# Patient Record
Sex: Male | Born: 1960 | Hispanic: No | Marital: Married | Smoking: Never smoker
Health system: Southern US, Community
[De-identification: ages and names within clinical notes are randomized; demographics above are authoritative.]

## PROBLEM LIST (undated history)

## (undated) DIAGNOSIS — Z86010 Personal history of colonic polyps: Secondary | ICD-10-CM

## (undated) DIAGNOSIS — E041 Nontoxic single thyroid nodule: Secondary | ICD-10-CM

## (undated) DIAGNOSIS — D1720 Benign lipomatous neoplasm of skin and subcutaneous tissue of unspecified limb: Secondary | ICD-10-CM

## (undated) DIAGNOSIS — K5790 Diverticulosis of intestine, part unspecified, without perforation or abscess without bleeding: Secondary | ICD-10-CM

## (undated) DIAGNOSIS — I10 Essential (primary) hypertension: Secondary | ICD-10-CM

## (undated) DIAGNOSIS — Z8639 Personal history of other endocrine, nutritional and metabolic disease: Secondary | ICD-10-CM

## (undated) HISTORY — DX: Nontoxic single thyroid nodule: E04.1

## (undated) HISTORY — DX: Essential (primary) hypertension: I10

## (undated) HISTORY — DX: Personal history of colonic polyps: Z86.010

## (undated) HISTORY — PX: POLYPECTOMY: SHX149

## (undated) HISTORY — DX: Benign lipomatous neoplasm of skin and subcutaneous tissue of unspecified limb: D17.20

## (undated) HISTORY — PX: COLONOSCOPY: SHX174

## (undated) HISTORY — DX: Diverticulosis of intestine, part unspecified, without perforation or abscess without bleeding: K57.90

## (undated) HISTORY — DX: Personal history of other endocrine, nutritional and metabolic disease: Z86.39

---

## 2011-01-18 DIAGNOSIS — Z860101 Personal history of adenomatous and serrated colon polyps: Secondary | ICD-10-CM

## 2011-01-18 DIAGNOSIS — Z86010 Personal history of colonic polyps: Secondary | ICD-10-CM

## 2011-01-18 HISTORY — DX: Personal history of colonic polyps: Z86.010

## 2011-01-18 HISTORY — DX: Personal history of adenomatous and serrated colon polyps: Z86.0101

## 2011-02-18 LAB — HM COLONOSCOPY

## 2014-02-14 ENCOUNTER — Ambulatory Visit: Payer: Self-pay | Admitting: Family Medicine

## 2014-03-17 ENCOUNTER — Encounter: Payer: Self-pay | Admitting: Family Medicine

## 2014-03-17 ENCOUNTER — Ambulatory Visit (INDEPENDENT_AMBULATORY_CARE_PROVIDER_SITE_OTHER): Payer: BC Managed Care – PPO | Admitting: Family Medicine

## 2014-03-17 VITALS — BP 125/82 | HR 73 | Temp 97.9°F | Resp 18 | Ht 70.0 in | Wt 164.0 lb

## 2014-03-17 DIAGNOSIS — Z0000 Encounter for general adult medical examination without abnormal findings: Secondary | ICD-10-CM

## 2014-03-17 DIAGNOSIS — Z125 Encounter for screening for malignant neoplasm of prostate: Secondary | ICD-10-CM

## 2014-03-17 DIAGNOSIS — Z23 Encounter for immunization: Secondary | ICD-10-CM

## 2014-03-17 DIAGNOSIS — I10 Essential (primary) hypertension: Secondary | ICD-10-CM

## 2014-03-17 DIAGNOSIS — E559 Vitamin D deficiency, unspecified: Secondary | ICD-10-CM

## 2014-03-17 LAB — COMPREHENSIVE METABOLIC PANEL
ALK PHOS: 42 U/L (ref 39–117)
ALT: 20 U/L (ref 0–53)
AST: 23 U/L (ref 0–37)
Albumin: 4.6 g/dL (ref 3.5–5.2)
BILIRUBIN TOTAL: 1.2 mg/dL (ref 0.2–1.2)
BUN: 14 mg/dL (ref 6–23)
CALCIUM: 9.4 mg/dL (ref 8.4–10.5)
CO2: 34 mEq/L — ABNORMAL HIGH (ref 19–32)
Chloride: 102 mEq/L (ref 96–112)
Creatinine, Ser: 0.84 mg/dL (ref 0.40–1.50)
GFR: 100.58 mL/min (ref >60.00)
Glucose, Bld: 83 mg/dL (ref 70–99)
POTASSIUM: 4 meq/L (ref 3.5–5.1)
SODIUM: 139 meq/L (ref 135–145)
Total Protein: 7.4 g/dL (ref 6.0–8.3)

## 2014-03-17 LAB — PSA: PSA: 0.33 ng/mL (ref 0.10–4.00)

## 2014-03-17 LAB — VITAMIN D 25 HYDROXY (VIT D DEFICIENCY, FRACTURES): VITD: 44.27 ng/mL (ref 30.00–100.00)

## 2014-03-17 LAB — LIPID PANEL
CHOL/HDL RATIO: 3
CHOLESTEROL: 198 mg/dL (ref 0–200)
HDL: 65 mg/dL (ref >39.00)
LDL Cholesterol: 112 mg/dL — ABNORMAL HIGH (ref 0–99)
NonHDL: 133
Triglycerides: 104 mg/dL (ref 0.0–149.0)
VLDL: 20.8 mg/dL (ref 0.0–40.0)

## 2014-03-17 LAB — CBC WITH DIFFERENTIAL/PLATELET
Basophils Absolute: 0 10*3/uL (ref 0.0–0.1)
Basophils Relative: 0.6 % (ref 0.0–3.0)
Eosinophils Absolute: 0.2 10*3/uL (ref 0.0–0.7)
Eosinophils Relative: 4.3 % (ref 0.0–5.0)
HEMATOCRIT: 41.4 % (ref 39.0–52.0)
HEMOGLOBIN: 14.1 g/dL (ref 13.0–17.0)
LYMPHS ABS: 1.7 10*3/uL (ref 0.7–4.0)
Lymphocytes Relative: 32.6 % (ref 12.0–46.0)
MCHC: 33.9 g/dL (ref 30.0–36.0)
MCV: 94.2 fl (ref 78.0–100.0)
Monocytes Absolute: 0.3 10*3/uL (ref 0.1–1.0)
Monocytes Relative: 6.2 % (ref 3.0–12.0)
Neutro Abs: 2.9 10*3/uL (ref 1.4–7.7)
Neutrophils Relative %: 56.3 % (ref 43.0–77.0)
Platelets: 191 10*3/uL (ref 150.0–400.0)
RBC: 4.4 Mil/uL (ref 4.22–5.81)
RDW: 12.5 % (ref 11.5–15.5)
WBC: 5.1 10*3/uL (ref 4.0–10.5)

## 2014-03-17 LAB — TSH: TSH: 1.34 u[IU]/mL (ref 0.35–4.50)

## 2014-03-17 MED ORDER — ATENOLOL 25 MG PO TABS: 25.0000 mg | ORAL_TABLET | Freq: Every day | ORAL | Status: DC

## 2014-03-17 NOTE — Progress Notes (Signed)
Pre visit review using our clinic review tool, if applicable. No additional management support is needed unless otherwise documented below in the visit note. 

## 2014-03-17 NOTE — Progress Notes (Signed)
Office Note 03/17/2014  CC:  Chief Complaint  Patient presents with  . Establish Care    HPI:  Joseph Friedman is a 54 y.o. male who is here to establish care. Patient's most recent primary MD: Dr. Posey Pronto at [facility]. Old records were reviewed prior to or during today's visit.  Old records reviewed, last MD visit was his CPE 02/2013 and all was good at that time, including fasting health panel and PSA level.  His vit D level was slightly low (low 30s) so he was told to increase his supplement to TWO of the 2000 mg vit D tabs qd.  No vaccine records were included.  He wanted Tdap booster today.  He travels a lot overseas to [place] and occasionally [place] for his work.  He can't recall if he has had Hep A screening.  BP has been well controlled and he is compliant with medication daily.  UTD with ophth exams and gets dental exams q72mo.  No acute complaints.  Fasting today.   Past Medical History  Diagnosis Date  . Hypertension     since 1990s  . History of vitamin D deficiency   . Benign thyroid cyst 2004    Repeated specialist f/u showed all was fine, no recurrence, has always been euthyroid.  . Lipoma of arm     confirmed by MRI in remote past.    Past Surgical History  Procedure Laterality Date  . Colonoscopy  02/2011    Adenomatous polyps+; recall 5 yrs (2018)    Family History  Problem Relation Age of Onset  . Hypertension Mother   . Heart disease Father   . Heart attack Brother     History   Social History  . Marital Status: Married    Spouse Name: N/A  . Number of Children: N/A  . Years of Education: N/A   Occupational History  . Not on file.   Social History Main Topics  . Smoking status: Never Smoker   . Smokeless tobacco: Never Used  . Alcohol Use: Yes     Comment: socially   . Drug Use: No  . Sexual Activity: Not on file   Other Topics Concern  . Not on file   Social History Narrative   Married, one adult daughter.   Lives  in [place].   Occup: Tree surgeon   No tobacco or drugs.   Alcohol: social.   Exercises at least 3X/week.    Outpatient Encounter Prescriptions as of 03/17/2014  Medication Sig  . aspirin 81 MG tablet Take 81 mg by mouth daily. Takes 5 days per week  . atenolol (TENORMIN) 25 MG tablet Take 1 tablet (25 mg total) by mouth daily.  . cholecalciferol (VITAMIN D) 1000 UNITS tablet Take 2,000 Units by mouth daily.  Marland Kitchen co-enzyme Q-10 30 MG capsule Take 10 mg by mouth daily.  . Multiple Vitamin (MULTIVITAMIN) tablet Take 1 tablet by mouth daily.  . [DISCONTINUED] atenolol (TENORMIN) 25 MG tablet Take 25 mg by mouth daily.    No Known Allergies  ROS Review of Systems  Constitutional: Negative for fever, chills, appetite change and fatigue.  HENT: Negative for congestion, dental problem, ear pain and sore throat.   Eyes: Negative for discharge, redness and visual disturbance.  Respiratory: Negative for cough, chest tightness, shortness of breath and wheezing.   Cardiovascular: Negative for chest pain, palpitations and leg swelling.  Gastrointestinal: Negative for nausea, vomiting, abdominal pain, diarrhea and blood  in stool.  Genitourinary: Negative for dysuria, urgency, frequency, hematuria, flank pain and difficulty urinating.  Musculoskeletal: Negative for myalgias, back pain, joint swelling, arthralgias and neck stiffness.  Skin: Negative for pallor and rash.  Neurological: Negative for dizziness, speech difficulty, weakness and headaches.  Hematological: Negative for adenopathy. Does not bruise/bleed easily.  Psychiatric/Behavioral: Negative for confusion and sleep disturbance. The patient is not nervous/anxious.     PE; Blood pressure 125/82, pulse 73, temperature 97.9 F (36.6 C), temperature source Temporal, resp. rate 18, height 5\' 10"  (1.778 m), weight 164 lb (74.39 kg), SpO2 98 %. Gen: Alert, well appearing.  Patient is oriented to person, place, time,  and situation. AFFECT: pleasant, lucid thought and speech. ENT: Ears: EACs clear, normal epithelium.  TMs with good light reflex and landmarks bilaterally.  Eyes: no injection, icteris, swelling, or exudate.  EOMI, PERRLA. Nose: no drainage or turbinate edema/swelling.  No injection or focal lesion.  Mouth: lips without lesion/swelling.  Oral mucosa pink and moist.  Dentition intact and without obvious caries or gingival swelling.  Oropharynx without erythema, exudate, or swelling.  Neck: supple/nontender.  No LAD, mass, or TM.  Carotid pulses 2+ bilaterally, without bruits. CV: RRR, no m/r/g.   LUNGS: CTA bilat, nonlabored resps, good aeration in all lung fields. ABD: soft, NT, ND, BS normal.  No hepatospenomegaly or mass.  No bruits. EXT: no clubbing, cyanosis, or edema.  Musculoskeletal: no joint swelling, erythema, warmth, or tenderness.  ROM of all joints intact. Skin - no sores or suspicious lesions or rashes or color changes Rectal exam: negative without mass, lesions or tenderness.  PROSTATE EXAM: smooth and symmetric without nodules or tenderness.  Pertinent labs:  None today  ASSESSMENT AND PLAN:   New pt; old records reviewed but encouraged pt to try to find any vaccine records he may have at home.  1) Health maintenance exam: Reviewed age and gender appropriate health maintenance issues (prudent diet, regular exercise, health risks of tobacco and excessive alcohol, use of seatbelts, fire alarms in home, use of sunscreen).  Also reviewed age and gender appropriate health screening as well as vaccine recommendations. Tdap today.  Recommended Hep A vaccine series but he'll return for this if he decides he wants it. HP labs + PSA.  DRE normal today. RF'd BP med.  Recheck Vit D level today.  An After Visit Summary was printed and given to the patient.  Return in about 1 year (around 03/17/2015) for annual CPE (fasting).

## 2014-03-18 ENCOUNTER — Telehealth: Payer: Self-pay | Admitting: Family Medicine

## 2014-03-18 NOTE — Telephone Encounter (Signed)
emmi emailed °

## 2014-03-19 ENCOUNTER — Telehealth: Payer: Self-pay | Admitting: Family Medicine

## 2014-03-19 NOTE — Telephone Encounter (Signed)
Patient LMOM stating that his LDL was up 30 points from last years check and his diet has not changed so he would like this test repeated.  Please advise.

## 2014-03-19 NOTE — Telephone Encounter (Signed)
Pt aware.  He has decided to wait to recheck.

## 2014-03-19 NOTE — Telephone Encounter (Signed)
Please notify pt that cholesterol numbers commonly do fluctuate over time even without a change in diet or exercise, and his LDL was still within normal limits for his level of cardiovascular risk, so there is no medical reason to repeat this test now.  If he insists on repeat, then he needs to know that his insurance will not pay for it.  Let me know-thx

## 2014-10-16 ENCOUNTER — Ambulatory Visit (INDEPENDENT_AMBULATORY_CARE_PROVIDER_SITE_OTHER): Payer: BC Managed Care – PPO

## 2014-10-16 DIAGNOSIS — Z23 Encounter for immunization: Secondary | ICD-10-CM

## 2015-03-18 ENCOUNTER — Encounter: Payer: Self-pay | Admitting: Family Medicine

## 2015-03-18 ENCOUNTER — Ambulatory Visit (INDEPENDENT_AMBULATORY_CARE_PROVIDER_SITE_OTHER): Payer: BC Managed Care – PPO | Admitting: Family Medicine

## 2015-03-18 VITALS — BP 121/80 | HR 74 | Temp 97.8°F | Resp 16 | Ht 69.0 in | Wt 160.0 lb

## 2015-03-18 DIAGNOSIS — Z8639 Personal history of other endocrine, nutritional and metabolic disease: Secondary | ICD-10-CM

## 2015-03-18 DIAGNOSIS — Z125 Encounter for screening for malignant neoplasm of prostate: Secondary | ICD-10-CM | POA: Diagnosis not present

## 2015-03-18 DIAGNOSIS — Z0000 Encounter for general adult medical examination without abnormal findings: Secondary | ICD-10-CM | POA: Diagnosis not present

## 2015-03-18 LAB — LIPID PANEL
Cholesterol: 185 mg/dL (ref 0–200)
HDL: 69 mg/dL (ref >39.00)
LDL Cholesterol: 101 mg/dL — ABNORMAL HIGH (ref 0–99)
NonHDL: 115.67
TRIGLYCERIDES: 72 mg/dL (ref 0.0–149.0)
Total CHOL/HDL Ratio: 3
VLDL: 14.4 mg/dL (ref 0.0–40.0)

## 2015-03-18 LAB — COMPREHENSIVE METABOLIC PANEL
ALT: 21 U/L (ref 0–53)
AST: 24 U/L (ref 0–37)
Albumin: 4.4 g/dL (ref 3.5–5.2)
Alkaline Phosphatase: 40 U/L (ref 39–117)
BUN: 12 mg/dL (ref 6–23)
CALCIUM: 9.4 mg/dL (ref 8.4–10.5)
CO2: 30 meq/L (ref 19–32)
CREATININE: 0.82 mg/dL (ref 0.40–1.50)
Chloride: 102 mEq/L (ref 96–112)
GFR: 103.05 mL/min (ref >60.00)
Glucose, Bld: 94 mg/dL (ref 70–99)
Potassium: 3.8 mEq/L (ref 3.5–5.1)
SODIUM: 138 meq/L (ref 135–145)
Total Bilirubin: 1 mg/dL (ref 0.2–1.2)
Total Protein: 7.1 g/dL (ref 6.0–8.3)

## 2015-03-18 LAB — CBC WITH DIFFERENTIAL/PLATELET
BASOS ABS: 0 10*3/uL (ref 0.0–0.1)
Basophils Relative: 0.3 % (ref 0.0–3.0)
EOS ABS: 0.2 10*3/uL (ref 0.0–0.7)
Eosinophils Relative: 4.2 % (ref 0.0–5.0)
HCT: 41.3 % (ref 39.0–52.0)
Hemoglobin: 13.9 g/dL (ref 13.0–17.0)
Lymphocytes Relative: 31 % (ref 12.0–46.0)
Lymphs Abs: 1.4 10*3/uL (ref 0.7–4.0)
MCHC: 33.6 g/dL (ref 30.0–36.0)
MCV: 94.6 fl (ref 78.0–100.0)
Monocytes Absolute: 0.3 10*3/uL (ref 0.1–1.0)
Monocytes Relative: 5.7 % (ref 3.0–12.0)
NEUTROS ABS: 2.7 10*3/uL (ref 1.4–7.7)
Neutrophils Relative %: 58.8 % (ref 43.0–77.0)
PLATELETS: 190 10*3/uL (ref 150.0–400.0)
RBC: 4.37 Mil/uL (ref 4.22–5.81)
RDW: 12.8 % (ref 11.5–15.5)
WBC: 4.5 10*3/uL (ref 4.0–10.5)

## 2015-03-18 LAB — VITAMIN D 25 HYDROXY (VIT D DEFICIENCY, FRACTURES): VITD: 41.79 ng/mL (ref 30.00–100.00)

## 2015-03-18 LAB — TSH: TSH: 1.25 u[IU]/mL (ref 0.35–4.50)

## 2015-03-18 LAB — PSA: PSA: 0.36 ng/mL (ref 0.10–4.00)

## 2015-03-18 MED ORDER — ATENOLOL 25 MG PO TABS: 25.0000 mg | ORAL_TABLET | Freq: Every day | ORAL | Status: DC

## 2015-03-18 NOTE — Progress Notes (Signed)
Office Note 03/18/2015  CC:  Chief Complaint  Patient presents with  . Annual Exam    Pt is fasting.    HPI:  Joseph Friedman is a 55 y.o. male who is here for annual health maintenance exam. Multiple bp measurements over the last 6 wks show avg syst 130s, avg diast 80s, avg HR 70s. A few measurements in 140s and low 150s.  Eye exam and dental preventatives UTD. Exercises regularly.   Past Medical History  Diagnosis Date  . Hypertension     since 1990s  . History of vitamin D deficiency   . Benign thyroid cyst 2004    Repeated specialist f/u showed all was fine, no recurrence, has always been euthyroid.  . Lipoma of arm     confirmed by MRI in remote past.  . History of adenomatous polyp of colon 2013    Recall 5 yrs    Past Surgical History  Procedure Laterality Date  . Colonoscopy  02/2011    Adenomatous polyps+; recall 5 yrs (2018)    Family History  Problem Relation Age of Onset  . Hypertension Mother   . Heart disease Father   . Heart attack Brother     Social History   Social History  . Marital Status: Married    Spouse Name: N/A  . Number of Children: N/A  . Years of Education: N/A   Occupational History  . Not on file.   Social History Main Topics  . Smoking status: Never Smoker   . Smokeless tobacco: Never Used  . Alcohol Use: Yes     Comment: socially   . Drug Use: No  . Sexual Activity: Not on file   Other Topics Concern  . Not on file   Social History Narrative   Married, one adult daughter.   Lives in [place].  Nationality: [place], relocated to [place] in the 1970s.   Ed: College grad.   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   No tobacco or drugs.   Alcohol: social.   Exercises at least 3X/week.    Outpatient Prescriptions Prior to Visit  Medication Sig Dispense Refill  . aspirin 81 MG tablet Take 81 mg by mouth daily. Takes 5 days per week    . cholecalciferol (VITAMIN D) 1000 UNITS tablet  Take 2,000 Units by mouth daily.    Marland Kitchen co-enzyme Q-10 30 MG capsule Take 10 mg by mouth daily.    . Multiple Vitamin (MULTIVITAMIN) tablet Take 1 tablet by mouth daily.    Marland Kitchen atenolol (TENORMIN) 25 MG tablet Take 1 tablet (25 mg total) by mouth daily. 90 tablet 3   No facility-administered medications prior to visit.    No Known Allergies  ROS Review of Systems  Constitutional: Negative for fever, chills, appetite change and fatigue.  HENT: Negative for congestion, dental problem, ear pain and sore throat.   Eyes: Negative for discharge, redness and visual disturbance.  Respiratory: Negative for cough, chest tightness, shortness of breath and wheezing.   Cardiovascular: Negative for chest pain, palpitations and leg swelling.  Gastrointestinal: Negative for nausea, vomiting, abdominal pain, diarrhea and blood in stool.  Genitourinary: Negative for dysuria, urgency, frequency, hematuria, flank pain and difficulty urinating.  Musculoskeletal: Positive for myalgias (left triceps since overdoing it on elliptical machine). Negative for back pain, joint swelling, arthralgias and neck stiffness.  Skin: Negative for pallor and rash.  Neurological: Negative for dizziness, speech difficulty, weakness and headaches.  Hematological: Negative for adenopathy.  Does not bruise/bleed easily.  Psychiatric/Behavioral: Negative for confusion and sleep disturbance. The patient is not nervous/anxious.     PE; Blood pressure 121/80, pulse 74, temperature 97.8 F (36.6 C), temperature source Oral, resp. rate 16, height 5\' 9"  (1.753 m), weight 160 lb (72.576 kg), SpO2 98 %. Gen: Alert, well appearing.  Patient is oriented to person, place, time, and situation. AFFECT: pleasant, lucid thought and speech. ENT: Ears: EACs clear, normal epithelium.  TMs with good light reflex and landmarks bilaterally.  Eyes: no injection, icteris, swelling, or exudate.  EOMI, PERRLA. Nose: no drainage or turbinate edema/swelling.   No injection or focal lesion.  Mouth: lips without lesion/swelling.  Oral mucosa pink and moist.  Dentition intact and without obvious caries or gingival swelling.  Oropharynx without erythema, exudate, or swelling.  Neck: supple/nontender.  No LAD, mass, or TM.  Carotid pulses 2+ bilaterally, without bruits. CV: RRR, no m/r/g.   LUNGS: CTA bilat, nonlabored resps, good aeration in all lung fields. ABD: soft, NT, ND, BS normal.  No hepatospenomegaly or mass.  No bruits. EXT: no clubbing, cyanosis, or edema.  Musculoskeletal: no joint swelling, erythema, warmth, or tenderness.  ROM of all joints intact. Skin - no sores or suspicious lesions or rashes or color changes Rectal exam: negative without mass, lesions or tenderness, PROSTATE EXAM: smooth and symmetric without nodules or tenderness.   Pertinent labs:  Lab Results  Component Value Date   TSH 1.34 03/17/2014   Lab Results  Component Value Date   WBC 5.1 03/17/2014   HGB 14.1 03/17/2014   HCT 41.4 03/17/2014   MCV 94.2 03/17/2014   PLT 191.0 03/17/2014   Lab Results  Component Value Date   CREATININE 0.84 03/17/2014   BUN 14 03/17/2014   NA 139 03/17/2014   K 4.0 03/17/2014   CL 102 03/17/2014   CO2 34* 03/17/2014   Lab Results  Component Value Date   ALT 20 03/17/2014   AST 23 03/17/2014   ALKPHOS 42 03/17/2014   BILITOT 1.2 03/17/2014   Lab Results  Component Value Date   CHOL 198 03/17/2014   Lab Results  Component Value Date   HDL 65.00 03/17/2014   Lab Results  Component Value Date   LDLCALC 112* 03/17/2014   Lab Results  Component Value Date   TRIG 104.0 03/17/2014   Lab Results  Component Value Date   CHOLHDL 3 03/17/2014   Lab Results  Component Value Date   PSA 0.33 03/17/2014    ASSESSMENT AND PLAN:   Health maintenance exam: Reviewed age and gender appropriate health maintenance issues (prudent diet, regular exercise, health risks of tobacco and excessive alcohol, use of seatbelts,  fire alarms in home, use of sunscreen).  Also reviewed age and gender appropriate health screening as well as vaccine recommendations. No vaccines due. Fasting HP labs + vit D today (for hx of vit D deficiency + ongoing vit D supplementation). DRE normal today, PSA drawn. Next colonoscopy due after 02/2016---we'll make referral to [facility] at that time b[place]c [place].  An After Visit Summary was printed and given to the patient.  FOLLOW UP:  Return in about 1 year (around 03/17/2016) for annual CPE (fasting).

## 2015-03-18 NOTE — Progress Notes (Signed)
Pre visit review using our clinic review tool, if applicable. No additional management support is needed unless otherwise documented below in the visit note. 

## 2015-10-20 ENCOUNTER — Ambulatory Visit (INDEPENDENT_AMBULATORY_CARE_PROVIDER_SITE_OTHER): Payer: BC Managed Care – PPO

## 2015-10-20 DIAGNOSIS — Z23 Encounter for immunization: Secondary | ICD-10-CM

## 2016-03-18 ENCOUNTER — Encounter: Payer: Self-pay | Admitting: Family Medicine

## 2016-03-18 ENCOUNTER — Ambulatory Visit (INDEPENDENT_AMBULATORY_CARE_PROVIDER_SITE_OTHER): Payer: BC Managed Care – PPO | Admitting: Family Medicine

## 2016-03-18 VITALS — BP 131/79 | HR 60 | Temp 98.1°F | Resp 16 | Ht 69.5 in | Wt 161.8 lb

## 2016-03-18 DIAGNOSIS — Z1211 Encounter for screening for malignant neoplasm of colon: Secondary | ICD-10-CM | POA: Diagnosis not present

## 2016-03-18 DIAGNOSIS — Z0000 Encounter for general adult medical examination without abnormal findings: Secondary | ICD-10-CM | POA: Diagnosis not present

## 2016-03-18 DIAGNOSIS — Z1159 Encounter for screening for other viral diseases: Secondary | ICD-10-CM

## 2016-03-18 DIAGNOSIS — Z9189 Other specified personal risk factors, not elsewhere classified: Secondary | ICD-10-CM | POA: Diagnosis not present

## 2016-03-18 DIAGNOSIS — Z125 Encounter for screening for malignant neoplasm of prostate: Secondary | ICD-10-CM | POA: Diagnosis not present

## 2016-03-18 DIAGNOSIS — E559 Vitamin D deficiency, unspecified: Secondary | ICD-10-CM | POA: Diagnosis not present

## 2016-03-18 LAB — COMPREHENSIVE METABOLIC PANEL
ALBUMIN: 4.4 g/dL (ref 3.5–5.2)
ALK PHOS: 36 U/L — AB (ref 39–117)
ALT: 17 U/L (ref 0–53)
AST: 19 U/L (ref 0–37)
BUN: 12 mg/dL (ref 6–23)
CO2: 31 mEq/L (ref 19–32)
CREATININE: 0.84 mg/dL (ref 0.40–1.50)
Calcium: 9.4 mg/dL (ref 8.4–10.5)
Chloride: 102 mEq/L (ref 96–112)
GFR: 99.86 mL/min (ref >60.00)
Glucose, Bld: 90 mg/dL (ref 70–99)
Potassium: 4.1 mEq/L (ref 3.5–5.1)
SODIUM: 137 meq/L (ref 135–145)
TOTAL PROTEIN: 7.1 g/dL (ref 6.0–8.3)
Total Bilirubin: 1.2 mg/dL (ref 0.2–1.2)

## 2016-03-18 LAB — LIPID PANEL
CHOLESTEROL: 174 mg/dL (ref 0–200)
HDL: 65.6 mg/dL (ref >39.00)
LDL Cholesterol: 98 mg/dL (ref 0–99)
NonHDL: 108.37
Total CHOL/HDL Ratio: 3
Triglycerides: 54 mg/dL (ref 0.0–149.0)
VLDL: 10.8 mg/dL (ref 0.0–40.0)

## 2016-03-18 LAB — CBC WITH DIFFERENTIAL/PLATELET
BASOS PCT: 1.1 % (ref 0.0–3.0)
Basophils Absolute: 0 10*3/uL (ref 0.0–0.1)
EOS ABS: 0.2 10*3/uL (ref 0.0–0.7)
Eosinophils Relative: 4 % (ref 0.0–5.0)
HCT: 39 % (ref 39.0–52.0)
Hemoglobin: 13.2 g/dL (ref 13.0–17.0)
Lymphocytes Relative: 36.3 % (ref 12.0–46.0)
Lymphs Abs: 1.4 10*3/uL (ref 0.7–4.0)
MCHC: 33.8 g/dL (ref 30.0–36.0)
MCV: 96.4 fl (ref 78.0–100.0)
MONO ABS: 0.3 10*3/uL (ref 0.1–1.0)
Monocytes Relative: 7.1 % (ref 3.0–12.0)
NEUTROS ABS: 1.9 10*3/uL (ref 1.4–7.7)
Neutrophils Relative %: 51.5 % (ref 43.0–77.0)
PLATELETS: 178 10*3/uL (ref 150.0–400.0)
RBC: 4.05 Mil/uL — ABNORMAL LOW (ref 4.22–5.81)
RDW: 12.4 % (ref 11.5–15.5)
WBC: 3.8 10*3/uL — AB (ref 4.0–10.5)

## 2016-03-18 LAB — VITAMIN D 25 HYDROXY (VIT D DEFICIENCY, FRACTURES): VITD: 43.64 ng/mL (ref 30.00–100.00)

## 2016-03-18 LAB — TSH: TSH: 1.56 u[IU]/mL (ref 0.35–4.50)

## 2016-03-18 LAB — PSA: PSA: 0.33 ng/mL (ref 0.10–4.00)

## 2016-03-18 MED ORDER — ATENOLOL 25 MG PO TABS: 25.0000 mg | ORAL_TABLET | Freq: Every day | ORAL | 3 refills | Status: DC

## 2016-03-18 NOTE — Progress Notes (Signed)
Pre visit review using our clinic review tool, if applicable. No additional management support is needed unless otherwise documented below in the visit note. 

## 2016-03-18 NOTE — Progress Notes (Signed)
Office Note 03/18/2016  CC:  Chief Complaint  Patient presents with  . Annual Exam    Pt is fasting.    HPI:  Joseph Friedman is a 56 y.o. male who is here for health maintenance exam. Reviewed vaccine records he brought in with him: all UTD. Summary of home bp measurement: 130/80 avg,  He is compliant with med, exercises, eats health diet.  Past Medical History:  Diagnosis Date  . Benign thyroid cyst 2004   Repeated specialist f/u showed all was fine, no recurrence, has always been euthyroid.  Marland Kitchen History of adenomatous polyp of colon 2013   Recall 5 yrs  . History of vitamin D deficiency   . Hypertension    since 1990s  . Lipoma of arm    confirmed by MRI in remote past.    Past Surgical History:  Procedure Laterality Date  . COLONOSCOPY  02/2011   Adenomatous polyps+; recall 5 yrs (2018)    Family History  Problem Relation Age of Onset  . Hypertension Mother   . Heart disease Father   . Heart attack Brother     Social History   Social History  . Marital status: Married    Spouse name: N/A  . Number of children: N/A  . Years of education: N/A   Occupational History  . Not on file.   Social History Main Topics  . Smoking status: Never Smoker  . Smokeless tobacco: Never Used  . Alcohol use Yes     Comment: socially   . Drug use: No  . Sexual activity: Not on file   Other Topics Concern  . Not on file   Social History Narrative   Married, one adult daughter.   Lives in [place].  Nationality: [place], relocated to [place] in the 1970s.   Ed: College grad.   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   No tobacco or drugs.   Alcohol: social.   Exercises at least 3X/week.    Outpatient Medications Prior to Visit  Medication Sig Dispense Refill  . aspirin 81 MG tablet Take 81 mg by mouth daily. Takes 5 days per week    . atenolol (TENORMIN) 25 MG tablet Take 1 tablet (25 mg total) by mouth daily. 90 tablet 3  .  cholecalciferol (VITAMIN D) 1000 UNITS tablet Take 2,000 Units by mouth daily.    Marland Kitchen co-enzyme Q-10 30 MG capsule Take 10 mg by mouth daily.    . Multiple Vitamin (MULTIVITAMIN) tablet Take 1 tablet by mouth daily.    . Omega-3 Fatty Acids (FISH OIL) 1000 MG CAPS Take 1 capsule by mouth daily.     No facility-administered medications prior to visit.     No Known Allergies  ROS Review of Systems  Constitutional: Negative for appetite change, chills, fatigue and fever.  HENT: Negative for congestion, dental problem, ear pain and sore throat.   Eyes: Negative for discharge, redness and visual disturbance.  Respiratory: Negative for cough, chest tightness, shortness of breath and wheezing.   Cardiovascular: Negative for chest pain, palpitations and leg swelling.  Gastrointestinal: Negative for abdominal pain, blood in stool, diarrhea, nausea and vomiting.  Genitourinary: Negative for difficulty urinating, dysuria, flank pain, frequency, hematuria and urgency.  Musculoskeletal: Negative for arthralgias, back pain, joint swelling, myalgias and neck stiffness.  Skin: Negative for pallor and rash.  Neurological: Negative for dizziness, speech difficulty, weakness and headaches.  Hematological: Negative for adenopathy. Does not bruise/bleed easily.  Psychiatric/Behavioral: Negative  for confusion and sleep disturbance. The patient is not nervous/anxious.     PE; Blood pressure 131/79, pulse 60, temperature 98.1 F (36.7 C), temperature source Oral, resp. rate 16, height 5' 9.5" (1.765 m), weight 161 lb 12 oz (73.4 kg), SpO2 99 %. Gen: Alert, well appearing.  Patient is oriented to person, place, time, and situation. AFFECT: pleasant, lucid thought and speech. ENT: Ears: EACs clear, normal epithelium.  TMs with good light reflex and landmarks bilaterally.  Eyes: no injection, icteris, swelling, or exudate.  EOMI, PERRLA. Nose: no drainage or turbinate edema/swelling.  No injection or focal  lesion.  Mouth: lips without lesion/swelling.  Oral mucosa pink and moist.  Dentition intact and without obvious caries or gingival swelling.  Oropharynx without erythema, exudate, or swelling.  Neck: supple/nontender.  No LAD, mass, or TM.  Carotid pulses 2+ bilaterally, without bruits. CV: RRR, no m/r/g.   LUNGS: CTA bilat, nonlabored resps, good aeration in all lung fields. ABD: soft, NT, ND, BS normal.  No hepatospenomegaly or mass.  No bruits. EXT: no clubbing, cyanosis, or edema.  Musculoskeletal: no joint swelling, erythema, warmth, or tenderness.  ROM of all joints intact. Skin - no sores or suspicious lesions or rashes or color changes Rectal exam: negative without mass, lesions or tenderness, PROSTATE EXAM: smooth and symmetric without nodules or tenderness.  Pertinent labs:  Lab Results  Component Value Date   TSH 1.25 03/18/2015   Lab Results  Component Value Date   WBC 4.5 03/18/2015   HGB 13.9 03/18/2015   HCT 41.3 03/18/2015   MCV 94.6 03/18/2015   PLT 190.0 03/18/2015   Lab Results  Component Value Date   CREATININE 0.82 03/18/2015   BUN 12 03/18/2015   NA 138 03/18/2015   K 3.8 03/18/2015   CL 102 03/18/2015   CO2 30 03/18/2015   Lab Results  Component Value Date   ALT 21 03/18/2015   AST 24 03/18/2015   ALKPHOS 40 03/18/2015   BILITOT 1.0 03/18/2015   Lab Results  Component Value Date   CHOL 185 03/18/2015   Lab Results  Component Value Date   HDL 69.00 03/18/2015   Lab Results  Component Value Date   LDLCALC 101 (H) 03/18/2015   Lab Results  Component Value Date   TRIG 72.0 03/18/2015   Lab Results  Component Value Date   CHOLHDL 3 03/18/2015   Lab Results  Component Value Date   PSA 0.36 03/18/2015   PSA 0.33 03/17/2014    ASSESSMENT AND PLAN:   Health maintenance exam Reviewed age and gender appropriate health maintenance issues (prudent diet, regular exercise, health risks of tobacco and excessive alcohol, use of seatbelts,  fire alarms in home, use of sunscreen).  Also reviewed age and gender appropriate health screening as well as vaccine recommendations. HTN: well controlled.  No changes in meds. Fasting HP labs today.  Hx of vit D def: check vit D level today.  Pt also desired Hep C screening today. Prostate cancer screening: DRE , PSA today. Colon ca screening: hx of adenomatous polyps--due for repeat colonoscopy this year--referred pt to GI for this today.  An After Visit Summary was printed and given to the patient.  FOLLOW UP:  Return in about 6 months (around 09/18/2016) for routine chronic illness f/u (HTN).  Signed:  Crissie Sickles, MD           03/18/2016

## 2016-03-19 LAB — HEPATITIS C ANTIBODY: HCV Ab: NEGATIVE

## 2016-04-20 ENCOUNTER — Encounter: Payer: Self-pay | Admitting: Family Medicine

## 2016-04-22 ENCOUNTER — Telehealth: Payer: Self-pay

## 2016-04-22 NOTE — Telephone Encounter (Signed)
ROI fax to [facility]

## 2016-04-29 ENCOUNTER — Telehealth: Payer: Self-pay | Admitting: Family Medicine

## 2016-04-29 NOTE — Telephone Encounter (Signed)
Received records from [facility] forwarded 2 pages to Dr. Anitra Lauth

## 2016-04-29 NOTE — Telephone Encounter (Signed)
Received records from [facility], forwarded 4 pages to Dr. Anitra Lauth.

## 2016-04-29 NOTE — Telephone Encounter (Signed)
Received records from [facility] forwarded 3 pages to Dr. Anitra Lauth.

## 2016-05-04 ENCOUNTER — Encounter: Payer: Self-pay | Admitting: Family Medicine

## 2016-05-09 ENCOUNTER — Telehealth: Payer: Self-pay | Admitting: Gastroenterology

## 2016-05-12 NOTE — Telephone Encounter (Signed)
Dr.Armbruster reviewed records and accepted for patient to be scheduled for a direct colonoscopy. Patient has been scheduled for direct colonoscopy 07/04/2016.

## 2016-06-16 ENCOUNTER — Ambulatory Visit (AMBULATORY_SURGERY_CENTER): Payer: Self-pay | Admitting: *Deleted

## 2016-06-16 VITALS — Ht 71.0 in | Wt 161.0 lb

## 2016-06-16 DIAGNOSIS — Z86010 Personal history of colonic polyps: Secondary | ICD-10-CM

## 2016-06-16 MED ORDER — NA SULFATE-K SULFATE-MG SULF 17.5-3.13-1.6 GM/177ML PO SOLN: 1.0000 | Freq: Once | ORAL | 0 refills | Status: AC

## 2016-06-16 NOTE — Progress Notes (Signed)
No egg or soy allergy known to patient  No issues with past sedation with any surgeries  or procedures, no intubation problems  No diet pills per patient No home 02 use per patient  No blood thinners per patient  Pt denies issues with constipation  No A fib or A flutter  EMMI video sent to pt's e mail  15$ coupon to pt for suprep

## 2016-06-20 ENCOUNTER — Encounter: Payer: Self-pay | Admitting: Gastroenterology

## 2016-07-04 ENCOUNTER — Encounter: Payer: BC Managed Care – PPO | Admitting: Gastroenterology

## 2016-07-26 ENCOUNTER — Encounter: Payer: Self-pay | Admitting: Family Medicine

## 2016-07-26 LAB — HM COLONOSCOPY

## 2016-07-27 ENCOUNTER — Encounter: Payer: Self-pay | Admitting: Family Medicine

## 2016-10-13 ENCOUNTER — Ambulatory Visit (INDEPENDENT_AMBULATORY_CARE_PROVIDER_SITE_OTHER): Payer: BC Managed Care – PPO | Admitting: Family Medicine

## 2016-10-13 ENCOUNTER — Encounter: Payer: Self-pay | Admitting: Family Medicine

## 2016-10-13 VITALS — BP 110/70 | HR 74 | Temp 98.0°F | Resp 16 | Ht 69.5 in | Wt 161.0 lb

## 2016-10-13 DIAGNOSIS — I10 Essential (primary) hypertension: Secondary | ICD-10-CM | POA: Diagnosis not present

## 2016-10-13 DIAGNOSIS — Z23 Encounter for immunization: Secondary | ICD-10-CM

## 2016-10-13 NOTE — Addendum Note (Signed)
Addended by: Onalee Hua on: 10/13/2016 09:47 AM   Modules accepted: Orders

## 2016-10-13 NOTE — Progress Notes (Signed)
OFFICE VISIT  10/13/2016   CC:  Chief Complaint  Patient presents with  . Follow-up    HTN, pt is not fasting.    HPI:    Patient is a 56 y.o.  male who presents for 6 mo f/u HTN. Home monitoring of BPs: avg 120s/70s avg.  HR avg 70s. Doing well, no complaints.  Taking ASA 81mg  M/T and Th/Fr.  Past Medical History:  Diagnosis Date  . Benign thyroid cyst 2004   Repeated specialist f/u showed all was fine, no recurrence, has always been euthyroid.  Marland Kitchen History of adenomatous polyp of colon 2013; 07/2016   Recall 5 yrs  . History of vitamin D deficiency   . Hypertension    since 1990s  . Lipoma of arm    confirmed by MRI in remote past.    Past Surgical History:  Procedure Laterality Date  . COLONOSCOPY  02/18/2011; 07/26/16   2013; Adenomatous polyps+; 2018 + polyps--recall 07/2021.  Marland Kitchen POLYPECTOMY      Outpatient Medications Prior to Visit  Medication Sig Dispense Refill  . aspirin 81 MG tablet Take 81 mg by mouth daily. Takes 5 days per week    . atenolol (TENORMIN) 25 MG tablet Take 1 tablet (25 mg total) by mouth daily. 90 tablet 3  . B Complex-C (SUPER B COMPLEX PO) Take by mouth daily.    . cholecalciferol (VITAMIN D) 1000 UNITS tablet Take 2,000 Units by mouth daily.    Marland Kitchen co-enzyme Q-10 30 MG capsule Take 10 mg by mouth daily.    . Multiple Vitamin (MULTIVITAMIN) tablet Take 1 tablet by mouth daily.    . Omega-3 Fatty Acids (FISH OIL) 1000 MG CAPS Take 1 capsule by mouth daily.     No facility-administered medications prior to visit.     Allergies  Allergen Reactions  . Augmentin [Amoxicillin-Pot Clavulanate] Diarrhea    ROS As per HPI  PE: Blood pressure 110/70, pulse 74, temperature 98 F (36.7 C), temperature source Oral, resp. rate 16, height 5' 9.5" (1.765 m), weight 161 lb (73 kg), SpO2 99 %. Gen: Alert, well appearing.  Patient is oriented to person, place, time, and situation. AFFECT: pleasant, lucid thought and speech. CV: RRR, no m/r/g.    LUNGS: CTA bilat, nonlabored resps, good aeration in all lung fields. EXT: no clubbing, cyanosis, or edema.    LABS:    Chemistry      Component Value Date/Time   NA 137 03/18/2016 0857   K 4.1 03/18/2016 0857   CL 102 03/18/2016 0857   CO2 31 03/18/2016 0857   BUN 12 03/18/2016 0857   CREATININE 0.84 03/18/2016 0857      Component Value Date/Time   CALCIUM 9.4 03/18/2016 0857   ALKPHOS 36 (L) 03/18/2016 0857   AST 19 03/18/2016 0857   ALT 17 03/18/2016 0857   BILITOT 1.2 03/18/2016 0857     Lab Results  Component Value Date   CHOL 174 03/18/2016   HDL 65.60 03/18/2016   LDLCALC 98 03/18/2016   TRIG 54.0 03/18/2016   CHOLHDL 3 03/18/2016   Lab Results  Component Value Date   TSH 1.56 03/18/2016   Lab Results  Component Value Date   WBC 3.8 (L) 03/18/2016   HGB 13.2 03/18/2016   HCT 39.0 03/18/2016   MCV 96.4 03/18/2016   PLT 178.0 03/18/2016   IMPRESSION AND PLAN:  Hypertension: well controlled.  The current medical regimen is effective;  continue present plan and medications. Continue exercise,  diet, ASA.  An After Visit Summary was printed and given to the patient.  FOLLOW UP: Return in about 6 months (around 04/12/2017) for annual CPE (fasting).  Signed:  Crissie Sickles, MD           10/13/2016

## 2017-03-22 ENCOUNTER — Encounter: Payer: Self-pay | Admitting: Family Medicine

## 2017-03-22 ENCOUNTER — Ambulatory Visit (INDEPENDENT_AMBULATORY_CARE_PROVIDER_SITE_OTHER): Payer: BC Managed Care – PPO | Admitting: Family Medicine

## 2017-03-22 VITALS — BP 137/68 | HR 71 | Temp 97.7°F | Resp 16 | Ht 69.5 in | Wt 159.8 lb

## 2017-03-22 DIAGNOSIS — I10 Essential (primary) hypertension: Secondary | ICD-10-CM

## 2017-03-22 DIAGNOSIS — Z201 Contact with and (suspected) exposure to tuberculosis: Secondary | ICD-10-CM

## 2017-03-22 DIAGNOSIS — Z8639 Personal history of other endocrine, nutritional and metabolic disease: Secondary | ICD-10-CM

## 2017-03-22 DIAGNOSIS — Z0000 Encounter for general adult medical examination without abnormal findings: Secondary | ICD-10-CM | POA: Diagnosis not present

## 2017-03-22 DIAGNOSIS — Z9229 Personal history of other drug therapy: Secondary | ICD-10-CM

## 2017-03-22 DIAGNOSIS — Z125 Encounter for screening for malignant neoplasm of prostate: Secondary | ICD-10-CM

## 2017-03-22 LAB — PSA: PSA: 0.38 ng/mL (ref 0.10–4.00)

## 2017-03-22 LAB — CBC WITH DIFFERENTIAL/PLATELET
BASOS ABS: 0 10*3/uL (ref 0.0–0.1)
Basophils Relative: 0.6 % (ref 0.0–3.0)
EOS ABS: 0.2 10*3/uL (ref 0.0–0.7)
Eosinophils Relative: 3.5 % (ref 0.0–5.0)
HEMATOCRIT: 40.2 % (ref 39.0–52.0)
HEMOGLOBIN: 13.8 g/dL (ref 13.0–17.0)
LYMPHS PCT: 35.8 % (ref 12.0–46.0)
Lymphs Abs: 1.7 10*3/uL (ref 0.7–4.0)
MCHC: 34.2 g/dL (ref 30.0–36.0)
MCV: 96.1 fl (ref 78.0–100.0)
MONO ABS: 0.3 10*3/uL (ref 0.1–1.0)
Monocytes Relative: 6.8 % (ref 3.0–12.0)
Neutro Abs: 2.6 10*3/uL (ref 1.4–7.7)
Neutrophils Relative %: 53.3 % (ref 43.0–77.0)
Platelets: 185 10*3/uL (ref 150.0–400.0)
RBC: 4.18 Mil/uL — AB (ref 4.22–5.81)
RDW: 12.5 % (ref 11.5–15.5)
WBC: 4.8 10*3/uL (ref 4.0–10.5)

## 2017-03-22 LAB — COMPREHENSIVE METABOLIC PANEL
ALBUMIN: 4.3 g/dL (ref 3.5–5.2)
ALK PHOS: 41 U/L (ref 39–117)
ALT: 20 U/L (ref 0–53)
AST: 20 U/L (ref 0–37)
BUN: 10 mg/dL (ref 6–23)
CALCIUM: 9.4 mg/dL (ref 8.4–10.5)
CO2: 32 mEq/L (ref 19–32)
Chloride: 100 mEq/L (ref 96–112)
Creatinine, Ser: 0.85 mg/dL (ref 0.40–1.50)
GFR: 98.16 mL/min (ref >60.00)
Glucose, Bld: 78 mg/dL (ref 70–99)
POTASSIUM: 3.9 meq/L (ref 3.5–5.1)
SODIUM: 136 meq/L (ref 135–145)
TOTAL PROTEIN: 7.1 g/dL (ref 6.0–8.3)
Total Bilirubin: 1 mg/dL (ref 0.2–1.2)

## 2017-03-22 LAB — LIPID PANEL
CHOLESTEROL: 171 mg/dL (ref 0–200)
HDL: 65.9 mg/dL (ref >39.00)
LDL Cholesterol: 92 mg/dL (ref 0–99)
NonHDL: 105.55
TRIGLYCERIDES: 68 mg/dL (ref 0.0–149.0)
Total CHOL/HDL Ratio: 3
VLDL: 13.6 mg/dL (ref 0.0–40.0)

## 2017-03-22 LAB — TSH: TSH: 1.7 u[IU]/mL (ref 0.35–4.50)

## 2017-03-22 NOTE — Progress Notes (Signed)
Office Note 03/22/2017  CC:  Chief Complaint  Patient presents with  . Annual Exam    Pt is fasting.     HPI:  Joseph Friedman is a 57 y.o. male who is here for annual health maintenance exam.   Feeling well: says daughter has been dx'd with latent TB.  He needs Quantiferon gold TB screen (he got BCG vaccine as a child).   Past Medical History:  Diagnosis Date  . Benign thyroid cyst 2004   Repeated specialist f/u showed all was fine, no recurrence, has always been euthyroid.  Marland Kitchen History of adenomatous polyp of colon 2013; 07/2016   Recall 5 yrs  . History of vitamin D deficiency   . Hypertension    since 1990s  . Lipoma of arm    confirmed by MRI in remote past.    Past Surgical History:  Procedure Laterality Date  . COLONOSCOPY  02/18/2011; 07/26/16   2013; Adenomatous polyps+; 2018 + polyps--recall 07/2021.  Marland Kitchen POLYPECTOMY      Family History  Problem Relation Age of Onset  . Hypertension Mother   . Heart disease Father   . Heart attack Brother   . Colon cancer Neg Hx   . Colon polyps Neg Hx   . Esophageal cancer Neg Hx   . Rectal cancer Neg Hx   . Stomach cancer Neg Hx     Social History   Socioeconomic History  . Marital status: Married    Spouse name: Not on file  . Number of children: Not on file  . Years of education: Not on file  . Highest education level: Not on file  Social Needs  . Financial resource strain: Not on file  . Food insecurity - worry: Not on file  . Food insecurity - inability: Not on file  . Transportation needs - medical: Not on file  . Transportation needs - non-medical: Not on file  Occupational History  . Not on file  Tobacco Use  . Smoking status: Never Smoker  . Smokeless tobacco: Never Used  Substance and Sexual Activity  . Alcohol use: Yes    Comment: socially   . Drug use: No  . Sexual activity: Not on file  Other Topics Concern  . Not on file  Social History Narrative   Married, one adult daughter.   Lives  in [place].  Nationality: [place], relocated to [place] in the 1970s.   Ed: College grad.   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   No tobacco or drugs.   Alcohol: social.   Exercises at least 3X/week.    Outpatient Medications Prior to Visit  Medication Sig Dispense Refill  . aspirin 81 MG tablet Take 81 mg by mouth daily. Takes 4 days per week    . atenolol (TENORMIN) 25 MG tablet Take 1 tablet (25 mg total) by mouth daily. 90 tablet 3  . B Complex-C (SUPER B COMPLEX PO) Take by mouth daily. Taking 4 days a week    . cholecalciferol (VITAMIN D) 1000 UNITS tablet Take 2,000 Units by mouth daily.    Marland Kitchen co-enzyme Q-10 30 MG capsule Take 10 mg by mouth daily.    . Multiple Vitamin (MULTIVITAMIN) tablet Take 1 tablet by mouth daily.    . Omega-3 Fatty Acids (FISH OIL) 1000 MG CAPS Take 1 capsule by mouth daily.     No facility-administered medications prior to visit.     Allergies  Allergen Reactions  . Augmentin [Amoxicillin-Pot  Clavulanate] Diarrhea    ROS Review of Systems  Constitutional: Negative for appetite change, chills, fatigue and fever.  HENT: Negative for congestion, dental problem, ear pain and sore throat.   Eyes: Negative for discharge, redness and visual disturbance.  Respiratory: Negative for cough, chest tightness, shortness of breath and wheezing.   Cardiovascular: Negative for chest pain, palpitations and leg swelling.  Gastrointestinal: Negative for abdominal pain, blood in stool, diarrhea, nausea and vomiting.  Genitourinary: Negative for difficulty urinating, dysuria, flank pain, frequency, hematuria and urgency.  Musculoskeletal: Negative for arthralgias, back pain, joint swelling, myalgias and neck stiffness.  Skin: Negative for pallor and rash.  Neurological: Negative for dizziness, speech difficulty, weakness and headaches.  Hematological: Negative for adenopathy. Does not bruise/bleed easily.  Psychiatric/Behavioral: Negative  for confusion and sleep disturbance. The patient is not nervous/anxious.     PE; Blood pressure 137/68, pulse 71, temperature 97.7 F (36.5 C), temperature source Oral, resp. rate 16, height 5' 9.5" (1.765 m), weight 159 lb 12 oz (72.5 kg), SpO2 100 %. Gen: Alert, well appearing.  Patient is oriented to person, place, time, and situation. AFFECT: pleasant, lucid thought and speech. ENT: Ears: EACs clear, normal epithelium.  TMs with good light reflex and landmarks bilaterally.  Eyes: no injection, icteris, swelling, or exudate.  EOMI, PERRLA. Nose: no drainage or turbinate edema/swelling.  No injection or focal lesion.  Mouth: lips without lesion/swelling.  Oral mucosa pink and moist.  Dentition intact and without obvious caries or gingival swelling.  Oropharynx without erythema, exudate, or swelling.  Neck: supple/nontender.  No LAD, mass, or TM.  Carotid pulses 2+ bilaterally, without bruits. CV: RRR, no m/r/g.   LUNGS: CTA bilat, nonlabored resps, good aeration in all lung fields. ABD: soft, NT, ND, BS normal.  No hepatospenomegaly or mass.  No bruits. EXT: no clubbing, cyanosis, or edema.  Musculoskeletal: no joint swelling, erythema, warmth, or tenderness.  ROM of all joints intact. Skin - no sores or suspicious lesions or rashes or color changes Rectal exam: negative without mass, lesions or tenderness, PROSTATE EXAM: smooth and symmetric without nodules or tenderness, RECTAL EXAM: negative without mass, lesions or tenderness.   Pertinent labs:  Lab Results  Component Value Date   TSH 1.56 03/18/2016   Lab Results  Component Value Date   WBC 3.8 (L) 03/18/2016   HGB 13.2 03/18/2016   HCT 39.0 03/18/2016   MCV 96.4 03/18/2016   PLT 178.0 03/18/2016   Lab Results  Component Value Date   CREATININE 0.84 03/18/2016   BUN 12 03/18/2016   NA 137 03/18/2016   K 4.1 03/18/2016   CL 102 03/18/2016   CO2 31 03/18/2016   Lab Results  Component Value Date   ALT 17 03/18/2016    AST 19 03/18/2016   ALKPHOS 36 (L) 03/18/2016   BILITOT 1.2 03/18/2016   Lab Results  Component Value Date   CHOL 174 03/18/2016   Lab Results  Component Value Date   HDL 65.60 03/18/2016   Lab Results  Component Value Date   LDLCALC 98 03/18/2016   Lab Results  Component Value Date   TRIG 54.0 03/18/2016   Lab Results  Component Value Date   CHOLHDL 3 03/18/2016   Lab Results  Component Value Date   PSA 0.33 03/18/2016   PSA 0.36 03/18/2015   PSA 0.33 03/17/2014     ASSESSMENT AND PLAN:   Health maintenance exam: Reviewed age and gender appropriate health maintenance issues (prudent diet, regular exercise,  health risks of tobacco and excessive alcohol, use of seatbelts, fire alarms in home, use of sunscreen).  Also reviewed age and gender appropriate health screening as well as vaccine recommendations. Vaccines: all UTD.  Shingrix discussed--he wants this and he'll call back and check on availability in near future. Labs: fasting HP + quantiferon gold TB screen (see HPI). Prostate ca screening: DRE normal , PSA. Colon ca screening: next colonoscopy due 2023.  Hx of thyroid cyst; aspirated in remote past.  Pt was told to f/u with endocrinologist every 5 yrs. He has not done this in quite some time: will make referral today.  An After Visit Summary was printed and given to the patient.  FOLLOW UP:  Return in about 6 months (around 09/22/2017) for routine chronic illness f/u.  Signed:  Crissie Sickles, MD           03/22/2017

## 2017-03-22 NOTE — Patient Instructions (Signed)

## 2017-03-24 LAB — QUANTIFERON-TB GOLD PLUS
Mitogen-NIL: >10 IU/mL
NIL: 0.02 [IU]/mL
QuantiFERON-TB Gold Plus: NEGATIVE
TB1-NIL: 0.03 IU/mL
TB2-NIL: 0.03 IU/mL

## 2017-05-07 NOTE — Progress Notes (Addendum)
Patient ID: Joseph Friedman, male   DOB: 11-Jun-1960, 57 y.o.   MRN: [id number]             Reason for Appointment: Evaluation of thyroid nodule    History of Present Illness:   The patient is being referred by his PCP Dr. Anitra Lauth  The patient's thyroid nodule was first discovered in 2005 on a routine exam with his PCP Report of ultrasound indicates that he had a complex cystic mass in the left lobe measuring 1.3 cm Apparently had aspiration of the nodule which showed insufficient cellularity, diagnosis indicated cystic degeneration and a follicular nodule He also had significant amount of fluid with this nodule  He believes he had another ultrasound done 2 years later but report is not available and he was told that there was no significant abnormality, he was told to follow-up in 5 years or so which he did not  Although he does not feel any swelling in his neck he was not told by his PCP that he had a thyroid enlargement he is here for further follow-up  He does not feel any pressure or choking in his neck does not have any difficulty swallowing  Thyroid levels have been consistently normal   Lab Results  Component Value Date   TSH 1.70 03/22/2017   TSH 1.56 03/18/2016   TSH 1.25 03/18/2015    Allergies as of 05/08/2017      Reactions   Augmentin [amoxicillin-pot Clavulanate] Diarrhea      Medication List        Accurate as of 05/08/17 11:23 AM. Always use your most recent med list.          aspirin 81 MG tablet Take 81 mg by mouth daily. Takes 4 days per week   atenolol 25 MG tablet Commonly known as:  TENORMIN Take 1 tablet (25 mg total) by mouth daily.   cholecalciferol 1000 units tablet Commonly known as:  VITAMIN D Take 2,000 Units by mouth daily.   co-enzyme Q-10 30 MG capsule Take 10 mg by mouth daily.   Fish Oil 1000 MG Caps Take 1 capsule by mouth daily.   multivitamin tablet Take 1 tablet by mouth daily.   SUPER B COMPLEX PO Take by mouth  daily. Taking 4 days a week       Allergies:  Allergies  Allergen Reactions  . Augmentin [Amoxicillin-Pot Clavulanate] Diarrhea    Past Medical History:  Diagnosis Date  . Benign thyroid cyst 2004   Repeated specialist f/u showed all was fine, no recurrence, has always been euthyroid.  Marland Kitchen History of adenomatous polyp of colon 2013; 07/2016   Recall 5 yrs  . History of vitamin D deficiency   . Hypertension    since 1990s  . Lipoma of arm    confirmed by MRI in remote past.    There is no history of radiation to the neck in childhood  Past Surgical History:  Procedure Laterality Date  . COLONOSCOPY  02/18/2011; 07/26/16   2013; Adenomatous polyps+; 2018 + polyps--recall 07/2021.  Marland Kitchen POLYPECTOMY      Family History  Problem Relation Age of Onset  . Hypertension Mother   . Thyroid disease Mother   . Heart disease Father   . Thyroid disease Sister   . Heart attack Brother   . Colon cancer Neg Hx   . Colon polyps Neg Hx   . Esophageal cancer Neg Hx   . Rectal cancer Neg Hx   . Stomach  cancer Neg Hx     Social History:  reports that he has never smoked. He has never used smokeless tobacco. He reports that he drinks alcohol. He reports that he does not use drugs.    Review of Systems  Constitutional: Negative for weight loss and weight gain.  HENT: Negative for trouble swallowing.   Respiratory: Negative for shortness of breath.   Endocrine: Negative for fatigue.  Musculoskeletal: Positive for joint pain.       Some finger joint pain present   Being treated long-term with atenolol for hypertension    Examination:   BP 126/70 (BP Location: Left Arm, Patient Position: Sitting, Cuff Size: Normal)   Pulse 84   Ht 5' 9.5" (1.765 m)   Wt 158 lb 6.4 oz (71.8 kg)   SpO2 95%   BMI 23.06 kg/m    General Appearance:  well-looking        Eyes: No abnormal prominence or swelling of the eyes         THYROID: Thyroid nodule is not palpable on the left side and only  slight soft fullness present on the medial left lobe. No other part of the thyroid is palpable   There is no lymphadenopathy in the neck  Heart sounds normal Lungs clear   Reflexes at biceps are normal.  Skin: No rash or lesions Extremities: No edema  Assessment/Plan:  Thyroid nodule: He had a mostly cystic nodule in 2005 that apparently resolved at least clinically with aspiration of the fluid He does have a previous 0.8 cm solid nodule in the isthmus Also has a family history of thyroid disease but he is not clear if his mother had cancer of the thyroid  Currently no nodule is palpable  He has been euthyroid consistently  Recommend that we do an ultrasound to follow-up to rule out any significant solid nodules since he has not had a ultrasound done in several years now However if he has no significant nodules at this time may not need any further follow-up  Discussed that he will usually not expect a change in his thyroid functions because of history of thyroid nodules and does not need annual thyroid levels to be checked   Consultation note sent to the referring physician  Elayne Snare 05/08/2017   ADDENDUM: Thyroid ultrasound report shows no significant nodules that need follow-up

## 2017-05-08 ENCOUNTER — Ambulatory Visit: Payer: BC Managed Care – PPO | Admitting: Endocrinology

## 2017-05-08 ENCOUNTER — Encounter: Payer: Self-pay | Admitting: Endocrinology

## 2017-05-08 DIAGNOSIS — E041 Nontoxic single thyroid nodule: Secondary | ICD-10-CM

## 2017-05-08 HISTORY — DX: Nontoxic single thyroid nodule: E04.1

## 2017-05-10 ENCOUNTER — Encounter: Payer: Self-pay | Admitting: Family Medicine

## 2017-05-23 ENCOUNTER — Telehealth: Payer: Self-pay | Admitting: Family Medicine

## 2017-05-26 NOTE — Telephone Encounter (Signed)
Per pt Pharmacy states they did not receive refill. Can this be resent. [facility]

## 2017-06-08 ENCOUNTER — Ambulatory Visit
Admission: RE | Admit: 2017-06-08 | Discharge: 2017-06-08 | Disposition: A | Discharge status: Home / Self Care | Payer: BC Managed Care – PPO | Source: Ambulatory Visit | Attending: Endocrinology | Admitting: Endocrinology

## 2017-06-08 DIAGNOSIS — E041 Nontoxic single thyroid nodule: Secondary | ICD-10-CM

## 2017-06-09 ENCOUNTER — Encounter (INDEPENDENT_AMBULATORY_CARE_PROVIDER_SITE_OTHER): Payer: Self-pay

## 2017-09-22 ENCOUNTER — Ambulatory Visit: Payer: BC Managed Care – PPO | Admitting: Family Medicine

## 2017-09-22 ENCOUNTER — Encounter: Payer: Self-pay | Admitting: Family Medicine

## 2017-09-22 VITALS — BP 131/70 | HR 75 | Temp 98.0°F | Resp 16 | Ht 69.5 in | Wt 161.4 lb

## 2017-09-22 DIAGNOSIS — I10 Essential (primary) hypertension: Secondary | ICD-10-CM | POA: Diagnosis not present

## 2017-09-22 DIAGNOSIS — Z23 Encounter for immunization: Secondary | ICD-10-CM | POA: Diagnosis not present

## 2017-09-22 NOTE — Patient Instructions (Signed)
Ask insurer about coverage for Shingrix (shingles vaccine).

## 2017-09-22 NOTE — Progress Notes (Signed)
OFFICE VISIT  09/22/2017   CC:  Chief Complaint  Patient presents with  . Follow-up    RCI, pt is not fasting.     HPI:    Patient is a 57 y.o.  male who presents for 6 mo f/u HTN. Feeling good.  Home bp's avg 130/80, HR avg low 70s. He maintains good exercise habits and healthy diet.  Past Medical History:  Diagnosis Date  . History of adenomatous polyp of colon 2013; 07/2016   Recall 5 yrs  . History of vitamin D deficiency   . Hypertension    since 1990s  . Lipoma of arm    confirmed by MRI in remote past.  . Thyroid nodule 05/08/2017   History of benign thyroid cyst.  Repeated specialist f/u showed all was fine, no recurrence, has always been euthyroid. Dr. Laureen Abrahams f/u 05/2017 showed multiple bilat nodules but none met criteria for bx--no further testing needed.    Past Surgical History:  Procedure Laterality Date  . COLONOSCOPY  02/18/2011; 07/26/16   2013; Adenomatous polyps+; 2018 + polyps--recall 07/2021.  Marland Kitchen POLYPECTOMY      Outpatient Medications Prior to Visit  Medication Sig Dispense Refill  . atenolol (TENORMIN) 25 MG tablet TAKE ONE TABLET BY MOUTH ONE TIME DAILY  90 tablet 1  . B Complex-C (SUPER B COMPLEX PO) Take by mouth daily. Taking 4 days a week    . cholecalciferol (VITAMIN D) 1000 UNITS tablet Take 2,000 Units by mouth daily.    Marland Kitchen co-enzyme Q-10 30 MG capsule Take 10 mg by mouth daily.    . Multiple Vitamin (MULTIVITAMIN) tablet Take 1 tablet by mouth daily.    . Omega-3 Fatty Acids (FISH OIL) 1000 MG CAPS Take 1 capsule by mouth daily.    Marland Kitchen aspirin 81 MG tablet Take 81 mg by mouth daily. Takes 4 days per week     No facility-administered medications prior to visit.     Allergies  Allergen Reactions  . Augmentin [Amoxicillin-Pot Clavulanate] Diarrhea    ROS As per HPI  PE: Blood pressure 131/70, pulse 75, temperature 98 F (36.7 C), temperature source Oral, resp. rate 16, height 5' 9.5" (1.765 m), weight 161 lb 6 oz (73.2 kg), SpO2  100 %. Gen: Alert, well appearing.  Patient is oriented to person, place, time, and situation. AFFECT: pleasant, lucid thought and speech. CV: RRR, no m/r/g.   LUNGS: CTA bilat, nonlabored resps, good aeration in all lung fields. EXT: no clubbing or cyanosis.  no edema.    LABS:  Lab Results  Component Value Date   TSH 1.70 03/22/2017   Lab Results  Component Value Date   WBC 4.8 03/22/2017   HGB 13.8 03/22/2017   HCT 40.2 03/22/2017   MCV 96.1 03/22/2017   PLT 185.0 03/22/2017   Lab Results  Component Value Date   CREATININE 0.85 03/22/2017   BUN 10 03/22/2017   NA 136 03/22/2017   K 3.9 03/22/2017   CL 100 03/22/2017   CO2 32 03/22/2017  GFR 03/22/17= 98 ml/min  Lab Results  Component Value Date   ALT 20 03/22/2017   AST 20 03/22/2017   ALKPHOS 41 03/22/2017   BILITOT 1.0 03/22/2017   Lab Results  Component Value Date   CHOL 171 03/22/2017   Lab Results  Component Value Date   HDL 65.90 03/22/2017   Lab Results  Component Value Date   LDLCALC 92 03/22/2017   Lab Results  Component Value Date  TRIG 68.0 03/22/2017   Lab Results  Component Value Date   CHOLHDL 3 03/22/2017   Lab Results  Component Value Date   PSA 0.38 03/22/2017   PSA 0.33 03/18/2016   PSA 0.36 03/18/2015   Vit D = 44 on 03/18/16  IMPRESSION AND PLAN:  HTN: The current medical regimen is effective;  continue present plan and medications. Lytes/cr normal 6 mo ago.  Plan repeat in 51moat CPE. We had a conversation about lack of good evidence for ASA use as primary CV prevention: decided to D/C ASA. Pt to check with his insurer about coverage for shingrix.  An After Visit Summary was printed and given to the patient.  FOLLOW UP: Return in about 6 months (around 03/23/2018) for annual CPE (fasting).  Signed:  PCrissie Sickles MD           09/22/2017

## 2017-11-18 ENCOUNTER — Other Ambulatory Visit: Payer: Self-pay | Admitting: Family Medicine

## 2018-01-17 DIAGNOSIS — N138 Other obstructive and reflux uropathy: Secondary | ICD-10-CM

## 2018-01-17 HISTORY — DX: Benign prostatic hyperplasia with lower urinary tract symptoms: N13.8

## 2018-02-22 ENCOUNTER — Encounter: Payer: Self-pay | Admitting: Family Medicine

## 2018-02-22 ENCOUNTER — Ambulatory Visit (INDEPENDENT_AMBULATORY_CARE_PROVIDER_SITE_OTHER): Payer: 59 | Admitting: Family Medicine

## 2018-02-22 VITALS — BP 130/77 | HR 66 | Temp 98.2°F | Resp 16 | Ht 70.0 in | Wt 157.0 lb

## 2018-02-22 DIAGNOSIS — Z0000 Encounter for general adult medical examination without abnormal findings: Secondary | ICD-10-CM | POA: Diagnosis not present

## 2018-02-22 DIAGNOSIS — Z23 Encounter for immunization: Secondary | ICD-10-CM | POA: Diagnosis not present

## 2018-02-22 DIAGNOSIS — Z125 Encounter for screening for malignant neoplasm of prostate: Secondary | ICD-10-CM

## 2018-02-22 LAB — CBC WITH DIFFERENTIAL/PLATELET
Basophils Absolute: 0 10*3/uL (ref 0.0–0.1)
Basophils Relative: 0.9 % (ref 0.0–3.0)
Eosinophils Absolute: 0.1 10*3/uL (ref 0.0–0.7)
Eosinophils Relative: 3.2 % (ref 0.0–5.0)
HEMATOCRIT: 40.6 % (ref 39.0–52.0)
HEMOGLOBIN: 13.6 g/dL (ref 13.0–17.0)
Lymphocytes Relative: 33.3 % (ref 12.0–46.0)
Lymphs Abs: 1.5 10*3/uL (ref 0.7–4.0)
MCHC: 33.5 g/dL (ref 30.0–36.0)
MCV: 95.7 fl (ref 78.0–100.0)
Monocytes Absolute: 0.3 10*3/uL (ref 0.1–1.0)
Monocytes Relative: 7.2 % (ref 3.0–12.0)
Neutro Abs: 2.5 10*3/uL (ref 1.4–7.7)
Neutrophils Relative %: 55.4 % (ref 43.0–77.0)
Platelets: 197 10*3/uL (ref 150.0–400.0)
RBC: 4.24 Mil/uL (ref 4.22–5.81)
RDW: 12.4 % (ref 11.5–15.5)
WBC: 4.5 10*3/uL (ref 4.0–10.5)

## 2018-02-22 LAB — PSA: PSA: 0.42 ng/mL (ref 0.10–4.00)

## 2018-02-22 LAB — LIPID PANEL
CHOL/HDL RATIO: 3
Cholesterol: 179 mg/dL (ref 0–200)
HDL: 62.9 mg/dL (ref >39.00)
LDL Cholesterol: 105 mg/dL — ABNORMAL HIGH (ref 0–99)
NonHDL: 116.23
Triglycerides: 56 mg/dL (ref 0.0–149.0)
VLDL: 11.2 mg/dL (ref 0.0–40.0)

## 2018-02-22 LAB — COMPREHENSIVE METABOLIC PANEL
ALT: 16 U/L (ref 0–53)
AST: 21 U/L (ref 0–37)
Albumin: 4.4 g/dL (ref 3.5–5.2)
Alkaline Phosphatase: 46 U/L (ref 39–117)
BUN: 11 mg/dL (ref 6–23)
CO2: 31 mEq/L (ref 19–32)
Calcium: 9.4 mg/dL (ref 8.4–10.5)
Chloride: 101 mEq/L (ref 96–112)
Creatinine, Ser: 0.86 mg/dL (ref 0.40–1.50)
GFR: 90.83 mL/min (ref >60.00)
Glucose, Bld: 82 mg/dL (ref 70–99)
POTASSIUM: 4.2 meq/L (ref 3.5–5.1)
Sodium: 137 mEq/L (ref 135–145)
Total Bilirubin: 1 mg/dL (ref 0.2–1.2)
Total Protein: 7.1 g/dL (ref 6.0–8.3)

## 2018-02-22 NOTE — Patient Instructions (Signed)

## 2018-02-22 NOTE — Addendum Note (Signed)
Addended by: Onalee Hua on: 02/22/2018 09:39 AM   Modules accepted: Orders

## 2018-02-22 NOTE — Progress Notes (Signed)
Office Note 02/22/2018  CC:  Chief Complaint  Patient presents with  . Annual Exam    Pt is Fasting.    HPI:  Joseph Friedman is a 58 y.o. male who is here for annual health maintenance exam.  Gets plenty of exercise and tries to eat healthy. Feels good. Home bp's 953-202, diastolic always < 80.   Past Medical History:  Diagnosis Date  . History of adenomatous polyp of colon 2013; 07/2016   Recall 5 yrs  . History of vitamin D deficiency   . Hypertension    since 1990s  . Lipoma of arm    confirmed by MRI in remote past.  . Thyroid nodule 05/08/2017   History of benign thyroid cyst.  Repeated specialist f/u showed all was fine, no recurrence, has always been euthyroid. Dr. Laureen Abrahams f/u 05/2017 showed multiple bilat nodules but none met criteria for bx--no further testing needed.    Past Surgical History:  Procedure Laterality Date  . COLONOSCOPY  02/18/2011; 07/26/16   2013; Adenomatous polyps+; 2018 + polyps--recall 07/2021.  Marland Kitchen POLYPECTOMY      Family History  Problem Relation Age of Onset  . Hypertension Mother   . Thyroid disease Mother   . Heart disease Father   . Thyroid disease Sister   . Heart attack Brother   . Colon cancer Neg Hx   . Colon polyps Neg Hx   . Esophageal cancer Neg Hx   . Rectal cancer Neg Hx   . Stomach cancer Neg Hx     Social History   Socioeconomic History  . Marital status: Married    Spouse name: Not on file  . Number of children: Not on file  . Years of education: Not on file  . Highest education level: Not on file  Occupational History  . Not on file  Social Needs  . Financial resource strain: Not on file  . Food insecurity:    Worry: Not on file    Inability: Not on file  . Transportation needs:    Medical: Not on file    Non-medical: Not on file  Tobacco Use  . Smoking status: Never Smoker  . Smokeless tobacco: Never Used  Substance and Sexual Activity  . Alcohol use: Yes    Comment: socially   . Drug use:  No  . Sexual activity: Not on file  Lifestyle  . Physical activity:    Days per week: Not on file    Minutes per session: Not on file  . Stress: Not on file  Relationships  . Social connections:    Talks on phone: Not on file    Gets together: Not on file    Attends religious service: Not on file    Active member of club or organization: Not on file    Attends meetings of clubs or organizations: Not on file    Relationship status: Not on file  . Intimate partner violence:    Fear of current or ex partner: Not on file    Emotionally abused: Not on file    Physically abused: Not on file    Forced sexual activity: Not on file  Other Topics Concern  . Not on file  Social History Narrative   Married, one adult daughter.   Lives in [place].  Nationality: [place], relocated to [place] in the 1970s.   Ed: College grad.   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   No tobacco or drugs.  Alcohol: social.   Exercises at least 3X/week.    Outpatient Medications Prior to Visit  Medication Sig Dispense Refill  . atenolol (TENORMIN) 25 MG tablet TAKE 1 TABLET BY MOUTH ONCE DAILY 90 tablet 1  . B Complex-C (SUPER B COMPLEX PO) Take by mouth daily. Taking 4 days a week    . cholecalciferol (VITAMIN D) 1000 UNITS tablet Take 2,000 Units by mouth daily.    Marland Kitchen co-enzyme Q-10 30 MG capsule Take 10 mg by mouth daily.    . Multiple Vitamin (MULTIVITAMIN) tablet Take 1 tablet by mouth daily.    . Omega-3 Fatty Acids (FISH OIL) 1000 MG CAPS Take 1 capsule by mouth daily.     No facility-administered medications prior to visit.     Allergies  Allergen Reactions  . Augmentin [Amoxicillin-Pot Clavulanate] Diarrhea    ROS Review of Systems  Constitutional: Negative for appetite change, chills, fatigue and fever.  HENT: Negative for congestion, dental problem, ear pain and sore throat.   Eyes: Negative for discharge, redness and visual disturbance.  Respiratory:  Negative for cough, chest tightness, shortness of breath and wheezing.   Cardiovascular: Negative for chest pain, palpitations and leg swelling.  Gastrointestinal: Negative for abdominal pain, blood in stool, diarrhea, nausea and vomiting.  Genitourinary: Negative for difficulty urinating, dysuria, flank pain, frequency, hematuria and urgency.  Musculoskeletal: Negative for arthralgias, back pain, joint swelling, myalgias and neck stiffness.  Skin: Negative for pallor and rash.  Neurological: Negative for dizziness, speech difficulty, weakness and headaches.  Hematological: Negative for adenopathy. Does not bruise/bleed easily.  Psychiatric/Behavioral: Negative for confusion and sleep disturbance. The patient is not nervous/anxious.     PE; Blood pressure 130/77, pulse 66, temperature 98.2 F (36.8 C), temperature source Oral, resp. rate 16, height '5\' 10"'  (1.778 m), weight 157 lb (71.2 kg), SpO2 100 %. Body mass index is 22.53 kg/m.  Gen: Alert, well appearing.  Patient is oriented to person, place, time, and situation. AFFECT: pleasant, lucid thought and speech. ENT: Ears: EACs clear, normal epithelium.  TMs with good light reflex and landmarks bilaterally.  Eyes: no injection, icteris, swelling, or exudate.  EOMI, PERRLA. Nose: no drainage or turbinate edema/swelling.  No injection or focal lesion.  Mouth: lips without lesion/swelling.  Oral mucosa pink and moist.  Dentition intact and without obvious caries or gingival swelling.  Oropharynx without erythema, exudate, or swelling.  Neck: supple/nontender.  No LAD, mass, or TM.  Carotid pulses 2+ bilaterally, without bruits. CV: RRR, no m/r/g.   LUNGS: CTA bilat, nonlabored resps, good aeration in all lung fields. ABD: soft, NT, ND, BS normal.  No hepatospenomegaly or mass.  No bruits. EXT: no clubbing, cyanosis, or edema.  Musculoskeletal: no joint swelling, erythema, warmth, or tenderness.  ROM of all joints intact. Skin - no sores or  suspicious lesions or rashes or color changes Rectal exam: negative without mass, lesions or tenderness, PROSTATE EXAM: smooth and symmetric without nodules or tenderness.   Pertinent labs:  Lab Results  Component Value Date   TSH 1.70 03/22/2017   Lab Results  Component Value Date   WBC 4.8 03/22/2017   HGB 13.8 03/22/2017   HCT 40.2 03/22/2017   MCV 96.1 03/22/2017   PLT 185.0 03/22/2017   Lab Results  Component Value Date   CREATININE 0.85 03/22/2017   BUN 10 03/22/2017   NA 136 03/22/2017   K 3.9 03/22/2017   CL 100 03/22/2017   CO2 32 03/22/2017   Lab Results  Component Value Date   ALT 20 03/22/2017   AST 20 03/22/2017   ALKPHOS 41 03/22/2017   BILITOT 1.0 03/22/2017   Lab Results  Component Value Date   CHOL 171 03/22/2017   Lab Results  Component Value Date   HDL 65.90 03/22/2017   Lab Results  Component Value Date   LDLCALC 92 03/22/2017   Lab Results  Component Value Date   TRIG 68.0 03/22/2017   Lab Results  Component Value Date   CHOLHDL 3 03/22/2017   Lab Results  Component Value Date   PSA 0.38 03/22/2017   PSA 0.33 03/18/2016   PSA 0.36 03/18/2015   No results found for: HGBA1C  ASSESSMENT AND PLAN:   Health maintenance exam: Reviewed age and gender appropriate health maintenance issues (prudent diet, regular exercise, health risks of tobacco and excessive alcohol, use of seatbelts, fire alarms in home, use of sunscreen).  Also reviewed age and gender appropriate health screening as well as vaccine recommendations. Vaccines: UTD.  Shingrix discussed-->will get #1 today. Labs: fasting HP + PSA. Prostate ca screening: DRE normal today , PSA. Colon ca screening: next colonoscopy due 2023.  An After Visit Summary was printed and given to the patient.  FOLLOW UP:  Return in about 1 year (around 02/23/2019) for annual CPE (fasting).  Signed:  Crissie Sickles, MD           02/22/2018

## 2018-02-23 ENCOUNTER — Encounter: Payer: Self-pay | Admitting: *Deleted

## 2018-03-27 ENCOUNTER — Encounter: Payer: BC Managed Care – PPO | Admitting: Family Medicine

## 2018-04-04 ENCOUNTER — Encounter: Payer: BC Managed Care – PPO | Admitting: Family Medicine

## 2018-04-12 ENCOUNTER — Encounter: Payer: BC Managed Care – PPO | Admitting: Family Medicine

## 2018-04-30 ENCOUNTER — Other Ambulatory Visit: Payer: Self-pay | Admitting: Family Medicine

## 2018-05-03 ENCOUNTER — Other Ambulatory Visit: Payer: Self-pay | Admitting: Family Medicine

## 2018-05-30 ENCOUNTER — Ambulatory Visit (INDEPENDENT_AMBULATORY_CARE_PROVIDER_SITE_OTHER): Payer: 59

## 2018-05-30 ENCOUNTER — Other Ambulatory Visit: Payer: Self-pay

## 2018-05-30 DIAGNOSIS — Z23 Encounter for immunization: Secondary | ICD-10-CM

## 2018-05-30 NOTE — Progress Notes (Signed)
Patient in for Shingrix #2.   Shingrix Vaccine administered IM in left deltoid. Patient tolerated injection well.   Roderic Ovens, RN

## 2018-08-21 ENCOUNTER — Other Ambulatory Visit: Payer: Self-pay | Admitting: Family Medicine

## 2018-10-02 ENCOUNTER — Encounter: Payer: Self-pay | Admitting: Family Medicine

## 2018-10-02 ENCOUNTER — Other Ambulatory Visit: Payer: Self-pay

## 2018-10-02 ENCOUNTER — Ambulatory Visit (INDEPENDENT_AMBULATORY_CARE_PROVIDER_SITE_OTHER): Payer: 59

## 2018-10-02 DIAGNOSIS — Z23 Encounter for immunization: Secondary | ICD-10-CM | POA: Diagnosis not present

## 2018-11-20 ENCOUNTER — Other Ambulatory Visit: Payer: Self-pay | Admitting: Family Medicine

## 2018-12-24 ENCOUNTER — Encounter: Payer: Self-pay | Admitting: Family Medicine

## 2018-12-24 ENCOUNTER — Other Ambulatory Visit: Payer: Self-pay

## 2018-12-24 ENCOUNTER — Ambulatory Visit: Payer: 59 | Admitting: Family Medicine

## 2018-12-24 VITALS — BP 125/78 | HR 59 | Temp 98.4°F | Resp 16 | Ht 70.0 in | Wt 159.0 lb

## 2018-12-24 DIAGNOSIS — R350 Frequency of micturition: Secondary | ICD-10-CM | POA: Diagnosis not present

## 2018-12-24 LAB — POCT URINALYSIS DIPSTICK
Bilirubin, UA: NEGATIVE
Blood, UA: NEGATIVE
Glucose, UA: NEGATIVE
Ketones, UA: NEGATIVE
Leukocytes, UA: NEGATIVE
Nitrite, UA: NEGATIVE
Protein, UA: NEGATIVE
Spec Grav, UA: <=1.005 — AB (ref 1.010–1.025)
Urobilinogen, UA: 0.2 E.U./dL
pH, UA: 6.5 (ref 5.0–8.0)

## 2018-12-24 MED ORDER — ATENOLOL 25 MG PO TABS: 25.0000 mg | ORAL_TABLET | Freq: Every day | ORAL | 3 refills | Status: DC

## 2018-12-24 MED ORDER — TAMSULOSIN HCL 0.4 MG PO CAPS: 0.4000 mg | ORAL_CAPSULE | Freq: Every day | ORAL | 3 refills | Status: DC

## 2018-12-24 NOTE — Progress Notes (Signed)
OFFICE VISIT  12/24/2018   CC:  Chief Complaint  Patient presents with  . Urinary symptoms    frequent urination, off and on since last year   HPI:    Patient is a 58 y.o.  male who presents for urinary symptoms. Urinary frequency.  No dysuria, no suprapubic pressure or pain. This has gradually been progressing over the last year.    No polydipsia. Nocturia x 1 since cutting back on fluids at night. Good urine flow.  Sense of incomplete emptying. +Dribbling at end of urination a lot.  Takes a long time to finish urinating. Occ urgency but no incontinence.  Occ leaks, though.  No fever, flank pain, gross hematuria, abd pain, constipation, malaise, or fatigue.  Past Medical History:  Diagnosis Date  . History of adenomatous polyp of colon 2013; 07/2016   Recall 5 yrs  . History of vitamin D deficiency   . Hypertension    since 1990s  . Lipoma of arm    confirmed by MRI in remote past.  . Thyroid nodule 05/08/2017   History of benign thyroid cyst.  Repeated specialist f/u showed all was fine, no recurrence, has always been euthyroid. Dr. Laureen Abrahams f/u 05/2017 showed multiple bilat nodules but none met criteria for bx--no further testing needed.    Past Surgical History:  Procedure Laterality Date  . COLONOSCOPY  02/18/2011; 07/26/16   2013; Adenomatous polyps+; 2018 + polyps--recall 07/2021.  Marland Kitchen POLYPECTOMY      Outpatient Medications Prior to Visit  Medication Sig Dispense Refill  . B Complex-C (SUPER B COMPLEX PO) Take by mouth daily. Taking 4 days a week    . cholecalciferol (VITAMIN D) 1000 UNITS tablet Take 2,000 Units by mouth daily.    Marland Kitchen co-enzyme Q-10 30 MG capsule Take 10 mg by mouth daily.    . Multiple Vitamin (MULTIVITAMIN) tablet Take 1 tablet by mouth daily.    . Omega-3 Fatty Acids (FISH OIL) 1000 MG CAPS Take 1 capsule by mouth daily.    Marland Kitchen atenolol (TENORMIN) 25 MG tablet TAKE ONE TABLET BY MOUTH ONE TIME DAILY  90 tablet 0   No facility-administered  medications prior to visit.     Allergies  Allergen Reactions  . Augmentin [Amoxicillin-Pot Clavulanate] Diarrhea    ROS As per HPI  PE: Blood pressure 125/78, pulse (!) 59, temperature 98.4 F (36.9 C), temperature source Temporal, resp. rate 16, height '5\' 10"'  (1.778 m), weight 159 lb (72.1 kg), SpO2 97 %. Gen: Alert, well appearing.  Patient is oriented to person, place, time, and situation. AFFECT: pleasant, lucid thought and speech. Rectal exam: negative without mass, lesions or tenderness, PROSTATE EXAM: smooth and symmetric without nodules or tenderness, enlarged 2+.   LABS:    Chemistry      Component Value Date/Time   NA 137 02/22/2018 0925   K 4.2 02/22/2018 0925   CL 101 02/22/2018 0925   CO2 31 02/22/2018 0925   BUN 11 02/22/2018 0925   CREATININE 0.86 02/22/2018 0925      Component Value Date/Time   CALCIUM 9.4 02/22/2018 0925   ALKPHOS 46 02/22/2018 0925   AST 21 02/22/2018 0925   ALT 16 02/22/2018 0925   BILITOT 1.0 02/22/2018 0925     POC CC UA today: NORMAL  Lab Results  Component Value Date   PSA 0.42 02/22/2018   PSA 0.38 03/22/2017   PSA 0.33 03/18/2016   IMPRESSION AND PLAN:  BPH with LUT obstructive sx's: start flomax 0.69m  qd. Therapeutic expectations and side effect profile of medication discussed today.  Patient's questions answered.  An After Visit Summary was printed and given to the patient.  FOLLOW UP: Return for keep f/u appt already scheduled (CPE).  Signed:  Crissie Sickles, MD           12/24/2018

## 2019-02-27 ENCOUNTER — Encounter: Payer: 59 | Admitting: Family Medicine

## 2019-03-13 ENCOUNTER — Other Ambulatory Visit: Payer: Self-pay

## 2019-03-13 ENCOUNTER — Ambulatory Visit (INDEPENDENT_AMBULATORY_CARE_PROVIDER_SITE_OTHER): Payer: 59 | Admitting: Family Medicine

## 2019-03-13 ENCOUNTER — Encounter: Payer: Self-pay | Admitting: Family Medicine

## 2019-03-13 VITALS — BP 117/67 | HR 63 | Temp 98.0°F | Resp 16 | Ht 70.0 in | Wt 159.0 lb

## 2019-03-13 DIAGNOSIS — Z0000 Encounter for general adult medical examination without abnormal findings: Secondary | ICD-10-CM

## 2019-03-13 DIAGNOSIS — H9190 Unspecified hearing loss, unspecified ear: Secondary | ICD-10-CM

## 2019-03-13 DIAGNOSIS — Z125 Encounter for screening for malignant neoplasm of prostate: Secondary | ICD-10-CM | POA: Diagnosis not present

## 2019-03-13 DIAGNOSIS — I10 Essential (primary) hypertension: Secondary | ICD-10-CM

## 2019-03-13 LAB — COMPREHENSIVE METABOLIC PANEL
ALT: 20 U/L (ref 0–53)
AST: 23 U/L (ref 0–37)
Albumin: 4.4 g/dL (ref 3.5–5.2)
Alkaline Phosphatase: 45 U/L (ref 39–117)
BUN: 15 mg/dL (ref 6–23)
CO2: 30 mEq/L (ref 19–32)
Calcium: 9.5 mg/dL (ref 8.4–10.5)
Chloride: 101 mEq/L (ref 96–112)
Creatinine, Ser: 0.87 mg/dL (ref 0.40–1.50)
GFR: 89.31 mL/min (ref >60.00)
Glucose, Bld: 92 mg/dL (ref 70–99)
Potassium: 4 mEq/L (ref 3.5–5.1)
Sodium: 137 mEq/L (ref 135–145)
Total Bilirubin: 1.1 mg/dL (ref 0.2–1.2)
Total Protein: 7.3 g/dL (ref 6.0–8.3)

## 2019-03-13 LAB — CBC WITH DIFFERENTIAL/PLATELET
Basophils Absolute: 0 10*3/uL (ref 0.0–0.1)
Basophils Relative: 1 % (ref 0.0–3.0)
Eosinophils Absolute: 0.2 10*3/uL (ref 0.0–0.7)
Eosinophils Relative: 4.3 % (ref 0.0–5.0)
HCT: 42.2 % (ref 39.0–52.0)
Hemoglobin: 14 g/dL (ref 13.0–17.0)
Lymphocytes Relative: 31.8 % (ref 12.0–46.0)
Lymphs Abs: 1.5 10*3/uL (ref 0.7–4.0)
MCHC: 33.3 g/dL (ref 30.0–36.0)
MCV: 96.5 fl (ref 78.0–100.0)
Monocytes Absolute: 0.3 10*3/uL (ref 0.1–1.0)
Monocytes Relative: 6 % (ref 3.0–12.0)
Neutro Abs: 2.7 10*3/uL (ref 1.4–7.7)
Neutrophils Relative %: 56.9 % (ref 43.0–77.0)
Platelets: 185 10*3/uL (ref 150.0–400.0)
RBC: 4.38 Mil/uL (ref 4.22–5.81)
RDW: 12.6 % (ref 11.5–15.5)
WBC: 4.7 10*3/uL (ref 4.0–10.5)

## 2019-03-13 LAB — LIPID PANEL
Cholesterol: 179 mg/dL (ref 0–200)
HDL: 63.5 mg/dL (ref >39.00)
LDL Cholesterol: 97 mg/dL (ref 0–99)
NonHDL: 115.4
Total CHOL/HDL Ratio: 3
Triglycerides: 94 mg/dL (ref 0.0–149.0)
VLDL: 18.8 mg/dL (ref 0.0–40.0)

## 2019-03-13 LAB — PSA: PSA: 0.46 ng/mL (ref 0.10–4.00)

## 2019-03-13 MED ORDER — ATENOLOL 25 MG PO TABS: 25.0000 mg | ORAL_TABLET | Freq: Every day | ORAL | 3 refills | Status: DC

## 2019-03-13 MED ORDER — TAMSULOSIN HCL 0.4 MG PO CAPS: 0.4000 mg | ORAL_CAPSULE | Freq: Every day | ORAL | 3 refills | Status: DC

## 2019-03-13 NOTE — Progress Notes (Signed)
Office Note 03/13/2019  CC:  Chief Complaint  Patient presents with  . Annual Exam    pt is fasting    HPI:  Joseph Friedman is a 59 y.o. male who is here for annual health maintenance exam.  Has had 1-2 mo on/off ant knee pain over patellar tendon, worse when bending knee.   No acute injury.  Exercises well, this helps, currently with no pain at all.  No knee swelling or instability sx's.  He says he his wife thinks he his hearing is impaired.  He does not notice anything. Asks for referral to audiologist.  Past Medical History:  Diagnosis Date  . BPH with obstruction/lower urinary tract symptoms 2020   flomax helping  . History of adenomatous polyp of colon 2013; 07/2016   Recall 5 yrs  . History of vitamin D deficiency   . Hypertension    since 1990s  . Lipoma of arm    confirmed by MRI in remote past.  . Thyroid nodule 05/08/2017   History of benign thyroid cyst.  Repeated specialist f/u showed all was fine, no recurrence, has always been euthyroid. Dr. Laureen Abrahams f/u 05/2017 showed multiple bilat nodules but none met criteria for bx--no further testing needed.    Past Surgical History:  Procedure Laterality Date  . COLONOSCOPY  02/18/2011; 07/26/16   2013; Adenomatous polyps+; 2018 + polyps--recall 07/2021.  Marland Kitchen POLYPECTOMY      Family History  Problem Relation Age of Onset  . Hypertension Mother   . Thyroid disease Mother   . Heart disease Father   . Thyroid disease Sister   . Heart attack Brother   . Colon cancer Neg Hx   . Colon polyps Neg Hx   . Esophageal cancer Neg Hx   . Rectal cancer Neg Hx   . Stomach cancer Neg Hx     Social History   Socioeconomic History  . Marital status: Married    Spouse name: Not on file  . Number of children: Not on file  . Years of education: Not on file  . Highest education level: Not on file  Occupational History  . Not on file  Tobacco Use  . Smoking status: Never Smoker  . Smokeless tobacco: Never Used   Substance and Sexual Activity  . Alcohol use: Yes    Comment: socially   . Drug use: No  . Sexual activity: Not on file  Other Topics Concern  . Not on file  Social History Narrative   Married, one adult daughter.   Lives in [place].  Nationality: [place], relocated to [place] in the 1970s.   Ed: College grad.   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   No tobacco or drugs.   Alcohol: social.   Exercises at least 3X/week.   Social Determinants of Health   Financial Resource Strain:   . Difficulty of Paying Living Expenses: Not on file  Food Insecurity:   . Worried About Charity fundraiser in the Last Year: Not on file  . Ran Out of Food in the Last Year: Not on file  Transportation Needs:   . Lack of Transportation (Medical): Not on file  . Lack of Transportation (Non-Medical): Not on file  Physical Activity:   . Days of Exercise per Week: Not on file  . Minutes of Exercise per Session: Not on file  Stress:   . Feeling of Stress : Not on file  Social Connections:   . Frequency  of Communication with Friends and Family: Not on file  . Frequency of Social Gatherings with Friends and Family: Not on file  . Attends Religious Services: Not on file  . Active Member of Clubs or Organizations: Not on file  . Attends Archivist Meetings: Not on file  . Marital Status: Not on file  Intimate Partner Violence:   . Fear of Current or Ex-Partner: Not on file  . Emotionally Abused: Not on file  . Physically Abused: Not on file  . Sexually Abused: Not on file    Outpatient Medications Prior to Visit  Medication Sig Dispense Refill  . B Complex-C (SUPER B COMPLEX PO) Take by mouth daily. Taking 4 days a week    . cholecalciferol (VITAMIN D) 1000 UNITS tablet Take 2,000 Units by mouth daily.    Marland Kitchen co-enzyme Q-10 30 MG capsule Take 10 mg by mouth daily.    . Multiple Vitamin (MULTIVITAMIN) tablet Take 1 tablet by mouth daily.    . Omega-3 Fatty  Acids (FISH OIL) 1000 MG CAPS Take 1 capsule by mouth daily.    Marland Kitchen atenolol (TENORMIN) 25 MG tablet Take 1 tablet (25 mg total) by mouth daily. 90 tablet 3  . tamsulosin (FLOMAX) 0.4 MG CAPS capsule Take 1 capsule (0.4 mg total) by mouth daily. 30 capsule 3   No facility-administered medications prior to visit.    Allergies  Allergen Reactions  . Augmentin [Amoxicillin-Pot Clavulanate] Diarrhea    ROS Review of Systems  Constitutional: Negative for appetite change, chills, fatigue and fever.  HENT: Negative for congestion, dental problem, ear pain and sore throat.   Eyes: Negative for discharge, redness and visual disturbance.  Respiratory: Negative for cough, chest tightness, shortness of breath and wheezing.   Cardiovascular: Negative for chest pain, palpitations and leg swelling.  Gastrointestinal: Negative for abdominal pain, blood in stool, diarrhea, nausea and vomiting.  Genitourinary: Negative for difficulty urinating, dysuria, flank pain, frequency, hematuria and urgency.  Musculoskeletal: Positive for arthralgias (L knee). Negative for back pain, joint swelling, myalgias and neck stiffness.  Skin: Negative for pallor and rash.  Neurological: Negative for dizziness, speech difficulty, weakness and headaches.  Hematological: Negative for adenopathy. Does not bruise/bleed easily.  Psychiatric/Behavioral: Negative for confusion and sleep disturbance. The patient is not nervous/anxious.     PE; Blood pressure 117/67, pulse 63, temperature 98 F (36.7 C), temperature source Temporal, resp. rate 16, height '5\' 10"'  (1.778 m), weight 159 lb (72.1 kg), SpO2 99 %. Body mass index is 22.81 kg/m.  Gen: Alert, well appearing.  Patient is oriented to person, place, time, and situation. AFFECT: pleasant, lucid thought and speech. ENT: Ears: EACs clear, normal epithelium.  TMs with good light reflex and landmarks bilaterally.  Eyes: no injection, icteris, swelling, or exudate.  EOMI,  PERRLA. Nose: no drainage or turbinate edema/swelling.  No injection or focal lesion.  Mouth: lips without lesion/swelling.  Oral mucosa pink and moist.  Dentition intact and without obvious caries or gingival swelling.  Oropharynx without erythema, exudate, or swelling.  Neck: supple/nontender.  No LAD, mass, or TM.  Carotid pulses 2+ bilaterally, without bruits. CV: RRR, no m/r/g.   LUNGS: CTA bilat, nonlabored resps, good aeration in all lung fields. ABD: soft, NT, ND, BS normal.  No hepatospenomegaly or mass.  No bruits. EXT: no clubbing, cyanosis, or edema.  Musculoskeletal: no joint swelling, erythema, warmth, or tenderness.  ROM of all joints intact. Skin - no sores or suspicious lesions or rashes or  color changes Rectal exam: negative without mass, lesions or tenderness, PROSTATE EXAM: smooth and symmetric without nodules or tenderness.  Pertinent labs:  Lab Results  Component Value Date   TSH 1.70 03/22/2017   Lab Results  Component Value Date   WBC 4.5 02/22/2018   HGB 13.6 02/22/2018   HCT 40.6 02/22/2018   MCV 95.7 02/22/2018   PLT 197.0 02/22/2018   Lab Results  Component Value Date   CREATININE 0.86 02/22/2018   BUN 11 02/22/2018   NA 137 02/22/2018   K 4.2 02/22/2018   CL 101 02/22/2018   CO2 31 02/22/2018   Lab Results  Component Value Date   ALT 16 02/22/2018   AST 21 02/22/2018   ALKPHOS 46 02/22/2018   BILITOT 1.0 02/22/2018   Lab Results  Component Value Date   CHOL 179 02/22/2018   Lab Results  Component Value Date   HDL 62.90 02/22/2018   Lab Results  Component Value Date   LDLCALC 105 (H) 02/22/2018   Lab Results  Component Value Date   TRIG 56.0 02/22/2018   Lab Results  Component Value Date   CHOLHDL 3 02/22/2018   Lab Results  Component Value Date   PSA 0.42 02/22/2018   PSA 0.38 03/22/2017   PSA 0.33 03/18/2016   ASSESSMENT AND PLAN:   Health maintenance exam: Reviewed age and gender appropriate health maintenance issues  (prudent diet, regular exercise, health risks of tobacco and excessive alcohol, use of seatbelts, fire alarms in home, use of sunscreen).  Also reviewed age and gender appropriate health screening as well as vaccine recommendations. Vaccines: ALL UTD including shingrix. Labs: fasting HP labs ordered, + PSA. Prostate ca screening: DRE normal ,PSA. Colon ca screening: recall 2023.  Left patellofemoral pain; reassured, discussed strengthening of medial quads. Signs/symptoms to call or return for were reviewed and pt expressed understanding.  An After Visit Summary was printed and given to the patient.  FOLLOW UP:  Return in about 1 year (around 03/12/2020) for annual CPE (fasting).  Signed:  Crissie Sickles, MD           03/13/2019

## 2019-03-13 NOTE — Patient Instructions (Signed)

## 2019-10-21 ENCOUNTER — Encounter: Payer: Self-pay | Admitting: Family Medicine

## 2019-10-21 ENCOUNTER — Ambulatory Visit (INDEPENDENT_AMBULATORY_CARE_PROVIDER_SITE_OTHER): Payer: 59 | Admitting: Family Medicine

## 2019-10-21 ENCOUNTER — Other Ambulatory Visit: Payer: Self-pay

## 2019-10-21 VITALS — BP 136/77 | HR 57 | Temp 97.6°F | Resp 16 | Ht 70.0 in | Wt 161.8 lb

## 2019-10-21 DIAGNOSIS — R350 Frequency of micturition: Secondary | ICD-10-CM | POA: Diagnosis not present

## 2019-10-21 DIAGNOSIS — N401 Enlarged prostate with lower urinary tract symptoms: Secondary | ICD-10-CM

## 2019-10-21 DIAGNOSIS — N3281 Overactive bladder: Secondary | ICD-10-CM

## 2019-10-21 DIAGNOSIS — Z23 Encounter for immunization: Secondary | ICD-10-CM | POA: Diagnosis not present

## 2019-10-21 DIAGNOSIS — N138 Other obstructive and reflux uropathy: Secondary | ICD-10-CM

## 2019-10-21 LAB — POCT URINALYSIS DIPSTICK
Bilirubin, UA: NEGATIVE
Blood, UA: NEGATIVE
Glucose, UA: NEGATIVE
Ketones, UA: NEGATIVE
Leukocytes, UA: NEGATIVE
Nitrite, UA: NEGATIVE
Protein, UA: NEGATIVE
Spec Grav, UA: 1.01 (ref 1.010–1.025)
Urobilinogen, UA: 0.2 E.U./dL
pH, UA: 7 (ref 5.0–8.0)

## 2019-10-21 MED ORDER — OXYBUTYNIN CHLORIDE ER 5 MG PO TB24: 5.0000 mg | ORAL_TABLET | Freq: Every day | ORAL | 1 refills | Status: DC

## 2019-10-21 NOTE — Progress Notes (Signed)
OFFICE VISIT  10/21/2019  CC:  Chief Complaint  Patient presents with  . Frequent urination    denies any burning but having urgency    HPI:    Patient is a 59 y.o. male with BPH who presents for urinary complaint. Urinary urgency and frequency hx, I put him on flomax. This has helped him "empty the tank, but signifiant urinary urgency continues. No nocturia.  No dysuria.  No excessive dribbling.  Has no urinary hesitancy or sense of incomplete emptying.  No excessive or unquenchable thirst.  Home bps: systolic consistently 676-195, diastolic always <09.    Past Medical History:  Diagnosis Date  . BPH with obstruction/lower urinary tract symptoms 2020   flomax helping  . History of adenomatous polyp of colon 2013; 07/2016   Recall 5 yrs  . History of vitamin D deficiency   . Hypertension    since 1990s  . Lipoma of arm    confirmed by MRI in remote past.  . Thyroid nodule 05/08/2017   History of benign thyroid cyst.  Repeated specialist f/u showed all was fine, no recurrence, has always been euthyroid. Dr. Laureen Abrahams f/u 05/2017 showed multiple bilat nodules but none met criteria for bx--no further testing needed.    Past Surgical History:  Procedure Laterality Date  . COLONOSCOPY  02/18/2011; 07/26/16   2013; Adenomatous polyps+; 2018 + polyps--recall 07/2021.  Marland Kitchen POLYPECTOMY      Outpatient Medications Prior to Visit  Medication Sig Dispense Refill  . atenolol (TENORMIN) 25 MG tablet Take 1 tablet (25 mg total) by mouth daily. 90 tablet 3  . B Complex-C (SUPER B COMPLEX PO) Take by mouth daily. Taking 4 days a week    . cholecalciferol (VITAMIN D) 1000 UNITS tablet Take 2,000 Units by mouth daily.    Marland Kitchen co-enzyme Q-10 30 MG capsule Take 10 mg by mouth daily.    . Multiple Vitamin (MULTIVITAMIN) tablet Take 1 tablet by mouth daily.    . Omega-3 Fatty Acids (FISH OIL) 1000 MG CAPS Take 1 capsule by mouth daily.    . tamsulosin (FLOMAX) 0.4 MG CAPS capsule Take 1 capsule  (0.4 mg total) by mouth daily. 90 capsule 3   No facility-administered medications prior to visit.    Allergies  Allergen Reactions  . Augmentin [Amoxicillin-Pot Clavulanate] Diarrhea    ROS As per HPI  PE: Vitals with BMI 10/21/2019 03/13/2019 12/24/2018  Height _0  _1  _2   Weight 161 lbs 13 oz 159 lbs 159 lbs  BMI 23.22 32.67 12.45  Systolic 809 983 382  Diastolic 77 67 78  Pulse 57 63 59     Gen: Alert, well appearing.  Patient is oriented to person, place, time, and situation. AFFECT: pleasant, lucid thought and speech. No further exam today.  LABS:    Chemistry      Component Value Date/Time   NA 137 03/13/2019 0922   K 4.0 03/13/2019 0922   CL 101 03/13/2019 0922   CO2 30 03/13/2019 0922   BUN 15 03/13/2019 0922   CREATININE 0.87 03/13/2019 0922      Component Value Date/Time   CALCIUM 9.5 03/13/2019 0922   ALKPHOS 45 03/13/2019 0922   AST 23 03/13/2019 0922   ALT 20 03/13/2019 0922   BILITOT 1.1 03/13/2019 0922     UA normal today  IMPRESSION AND PLAN:  OAB/urge incontinence.  He has some BPH which has been helped well by flomax 0.2m qd. Add Oxybutynin xl 575mqd.  Therapeutic expectations and side effect profile of medication discussed today.  Patient's questions answered. Continue flomax 0.35m qd as well.  HTN: reassured pt bp ok and no need for changes. Continue atenolol.  An After Visit Summary was printed and given to the patient.  FOLLOW UP: Return in about 4 weeks (around 11/18/2019) for f/u urinary.  Signed:  PCrissie Sickles MD           10/21/2019

## 2019-11-21 ENCOUNTER — Other Ambulatory Visit: Payer: Self-pay

## 2019-11-21 ENCOUNTER — Ambulatory Visit: Payer: 59 | Admitting: Family Medicine

## 2019-11-21 ENCOUNTER — Encounter: Payer: Self-pay | Admitting: Family Medicine

## 2019-11-21 VITALS — BP 134/69 | HR 71 | Temp 97.7°F | Resp 16 | Ht 70.0 in | Wt 165.2 lb

## 2019-11-21 DIAGNOSIS — N138 Other obstructive and reflux uropathy: Secondary | ICD-10-CM

## 2019-11-21 DIAGNOSIS — L989 Disorder of the skin and subcutaneous tissue, unspecified: Secondary | ICD-10-CM | POA: Diagnosis not present

## 2019-11-21 DIAGNOSIS — N401 Enlarged prostate with lower urinary tract symptoms: Secondary | ICD-10-CM | POA: Diagnosis not present

## 2019-11-21 DIAGNOSIS — T50905A Adverse effect of unspecified drugs, medicaments and biological substances, initial encounter: Secondary | ICD-10-CM

## 2019-11-21 DIAGNOSIS — R350 Frequency of micturition: Secondary | ICD-10-CM | POA: Diagnosis not present

## 2019-11-21 NOTE — Progress Notes (Signed)
OFFICE VISIT  11/21/2019  CC:  Chief Complaint  Patient presents with  . Follow-up    urinary, started on oxybutynin xl 23m but caused bad rash so stopped taking after 2 or 3 days.    HPI:    Patient is a 59y.o. male who presents for 1 mo f/u urinary complaints. A/P as of last visit: "OAB/urge incontinence.  He has some BPH which has been helped well by flomax 0.4732mqd. Add Oxybutynin xl 32m46md.   Therapeutic expectations and side effect profile of medication discussed today.  Patient's questions answered. Continue flomax 0.4mg44m as well."  INTERIM HX: Onset of itching all over 2 d/after starting the oxybutynin so he stopped it.  No rash ever developed. Itching resolved completely in 1-2d.  He did also take one zyrtec tab. He has worked on cutting back some on his fluid intake and this has led to some improvement in his urinary frequency yet he has NOT cut back so much as to lead to dehydration.  C/o recurrent focal swelling on central chest that sometimes starts to hurt and drains, did this recently, has now gone back to "normal" w/out any further drainage and no tenderness.  He applies neosporin when it flares up.  No other lesions similar to this on his skin.   Past Medical History:  Diagnosis Date  . BPH with obstruction/lower urinary tract symptoms 2020   flomax helping  . History of adenomatous polyp of colon 2013; 07/2016   Recall 5 yrs  . History of vitamin D deficiency   . Hypertension    since 1990s  . Lipoma of arm    confirmed by MRI in remote past.  . Thyroid nodule 05/08/2017   History of benign thyroid cyst.  Repeated specialist f/u showed all was fine, no recurrence, has always been euthyroid. Dr. KumaLaureen Abrahams 05/2017 showed multiple bilat nodules but none met criteria for bx--no further testing needed.    Past Surgical History:  Procedure Laterality Date  . COLONOSCOPY  02/18/2011; 07/26/16   2013; Adenomatous polyps+; 2018 + polyps--recall 07/2021.  .  PMarland KitchenLYPECTOMY      Outpatient Medications Prior to Visit  Medication Sig Dispense Refill  . atenolol (TENORMIN) 25 MG tablet Take 1 tablet (25 mg total) by mouth daily. 90 tablet 3  . B Complex-C (SUPER B COMPLEX PO) Take by mouth daily. Taking 4 days a week    . cholecalciferol (VITAMIN D) 1000 UNITS tablet Take 2,000 Units by mouth daily.    . coMarland Kitchenenzyme Q-10 30 MG capsule Take 10 mg by mouth daily.    . Multiple Vitamin (MULTIVITAMIN) tablet Take 1 tablet by mouth daily.    . Omega-3 Fatty Acids (FISH OIL) 1000 MG CAPS Take 1 capsule by mouth daily.    . tamsulosin (FLOMAX) 0.4 MG CAPS capsule Take 1 capsule (0.4 mg total) by mouth daily. 90 capsule 3  . oxybutynin (DITROPAN-XL) 5 MG 24 hr tablet Take 1 tablet (5 mg total) by mouth at bedtime. (Patient not taking: Reported on 11/21/2019) 30 tablet 1   No facility-administered medications prior to visit.    Allergies  Allergen Reactions  . Augmentin [Amoxicillin-Pot Clavulanate] Diarrhea  . Ditropan [Oxybutynin] Rash    ROS As per HPI  PE: Vitals with BMI 11/21/2019 10/21/2019 03/13/2019  Height '5\' 10"'  '5\' 10"'  '5\' 10"'   Weight 165 lbs 3 oz 161 lbs 13 oz 159 lbs  BMI 23.7 [id number] 134 338 250 539astolic  69 77 67  Pulse 71 57 63     Gen: Alert, well appearing.  Patient is oriented to person, place, time, and situation. AFFECT: pleasant, lucid thought and speech. L USB region with 2 cm oval, slightly raised area of skin that is a bit rubbery texture, w/out erythema or tenderness or drainage opening present. He is very hairy over his chest.  LABS:    Chemistry      Component Value Date/Time   NA 137 03/13/2019 0922   K 4.0 03/13/2019 0922   CL 101 03/13/2019 0922   CO2 30 03/13/2019 0922   BUN 15 03/13/2019 0922   CREATININE 0.87 03/13/2019 0922      Component Value Date/Time   CALCIUM 9.5 03/13/2019 0922   ALKPHOS 45 03/13/2019 0922   AST 23 03/13/2019 0922   ALT 20 03/13/2019 0922   BILITOT 1.1 03/13/2019  0922     Lab Results  Component Value Date   WBC 4.7 03/13/2019   HGB 14.0 03/13/2019   HCT 42.2 03/13/2019   MCV 96.5 03/13/2019   PLT 185.0 03/13/2019   IMPRESSION AND PLAN:  1) BPH, well controlled with flomax. Some urinary frequency that was not helped by flomax-->trial of oxybut recently resulted in [facility] w/out rash.  He has made adjustments in fluid intake (volume and timing) and seems to be doing better and wants to hold off on any further med trials at this time. He'll continue flomax 0.4 qhs.  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 Currently has gone back down/no treatment needed. I don't think excision of this will help him.  I recommended he shave this area of his chest wall, continue to monitor and to apply otc neosporin prn.  He wants to also address this with his dermatologist he's seeing soon (for f/u of some benign skin lesions) and I think this is a good idea as well.  An After Visit Summary was printed and given to the patient.  FOLLOW UP: Return for keep cpe appt set for 03/13/20.  Signed:  Crissie Sickles, MD           11/21/2019

## 2020-01-26 IMAGING — US US THYROID
1 series · 13 of 25 positions shown · non-contrast
Comparison: None.

CLINICAL DATA: Other. 56-year-old male with a history of left-sided
thyroid cyst which was previously drained

EXAM:
THYROID ULTRASOUND
TECHNIQUE: Ultrasound examination of the thyroid gland and adjacent soft
tissues was performed.

[Series 1: us thyroid · 0.06mm/px · 13 of 47 slices shown]
[im 1/47]
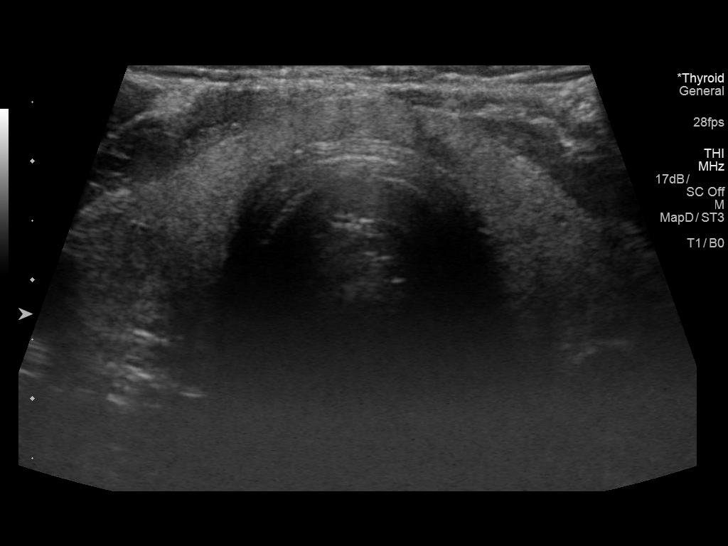
[im 4/47]
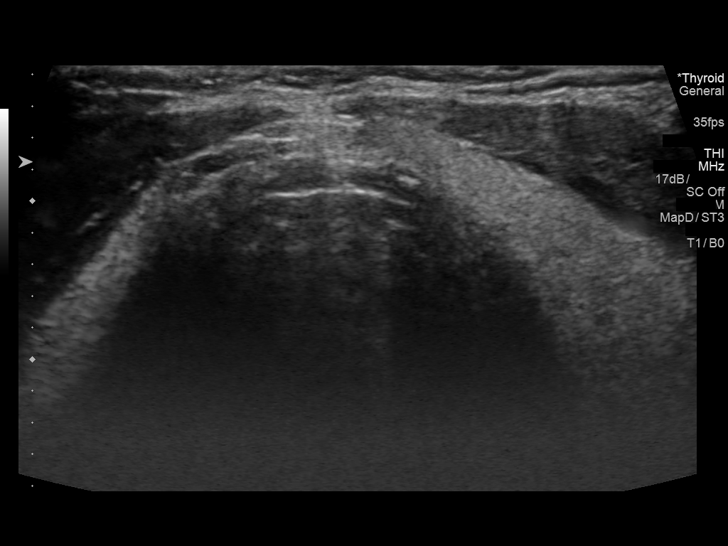
[im 8/47]
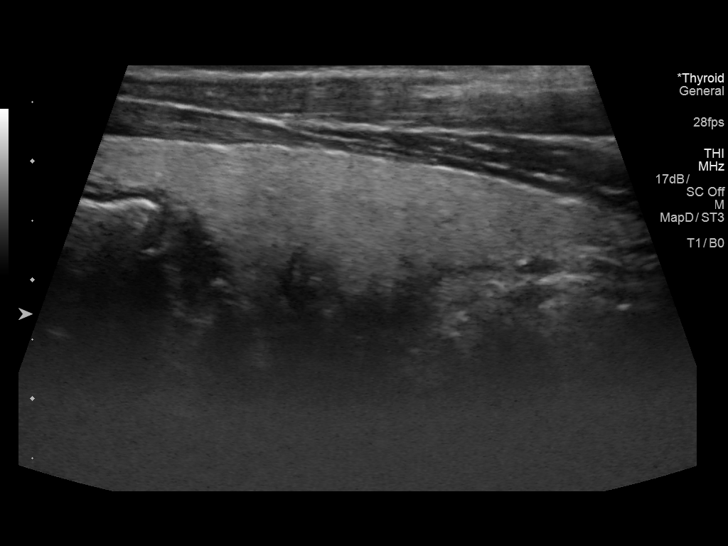
[im 12/47]
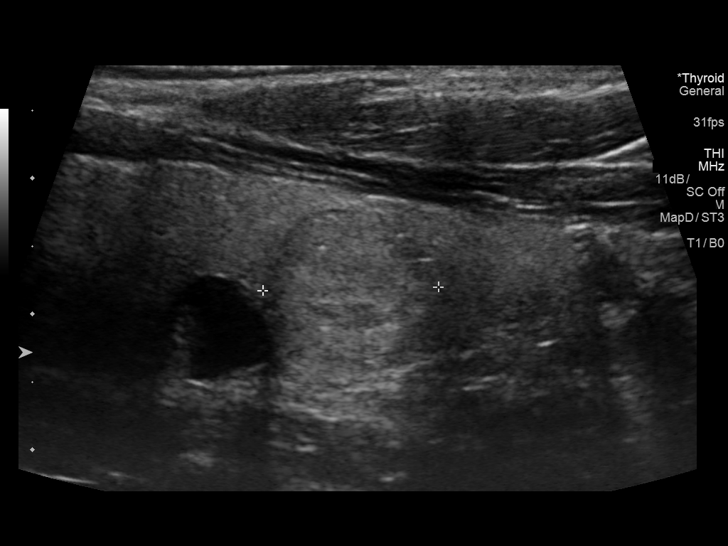
[im 16/47]
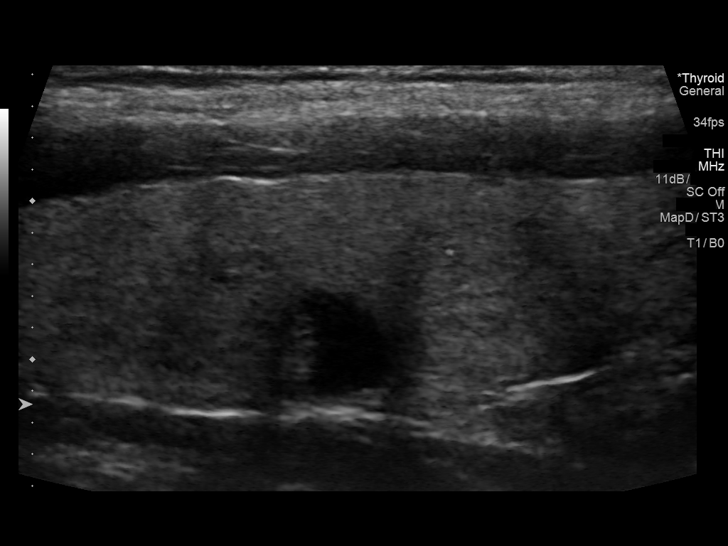
[im 20/47]
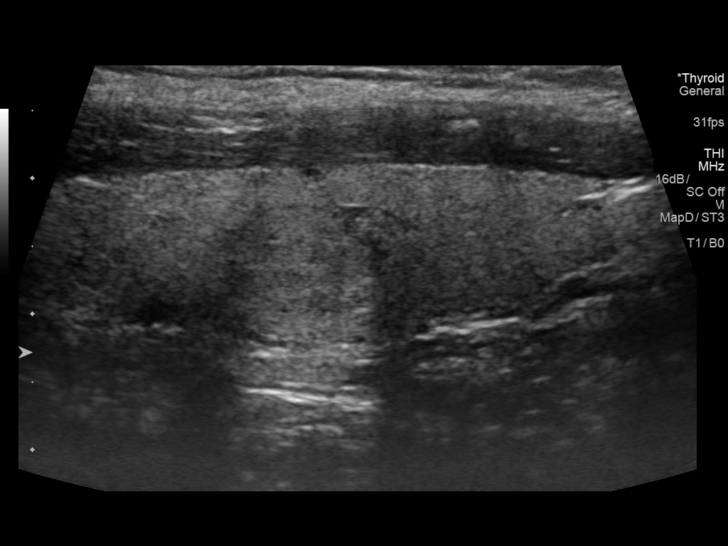
[im 24/47]
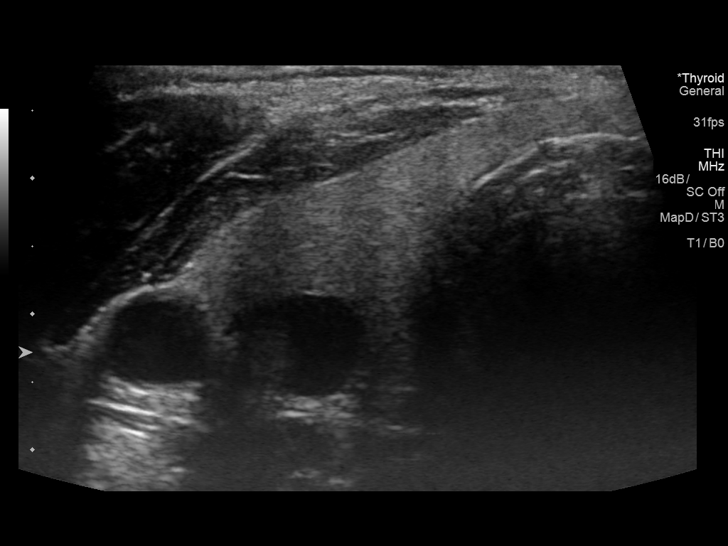
[im 27/47]
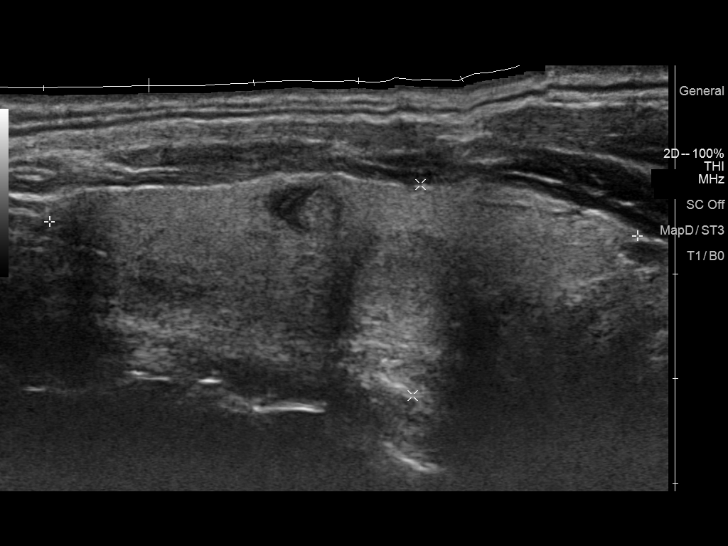
[im 31/47]
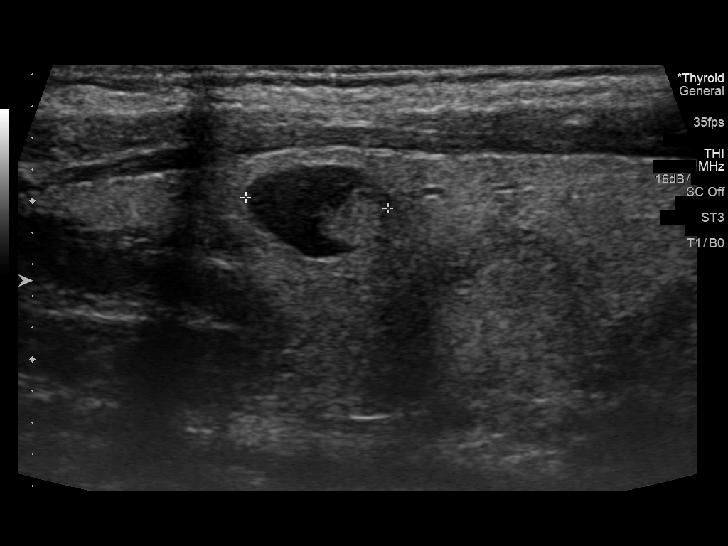
[im 35/47]
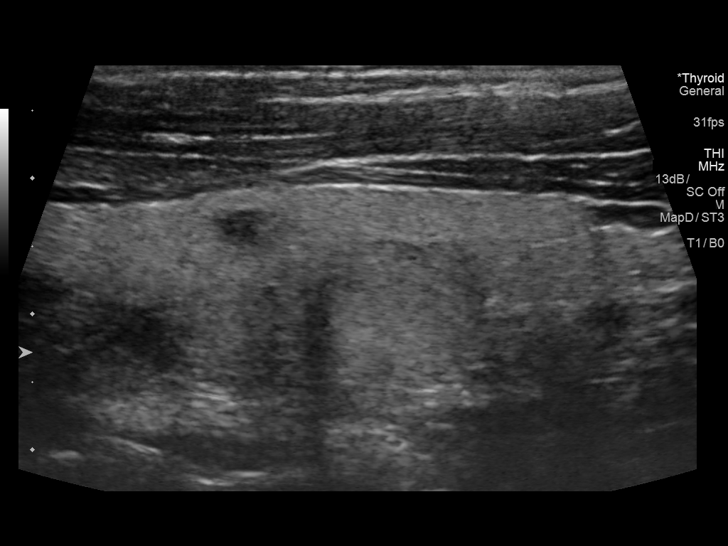
[im 39/47]
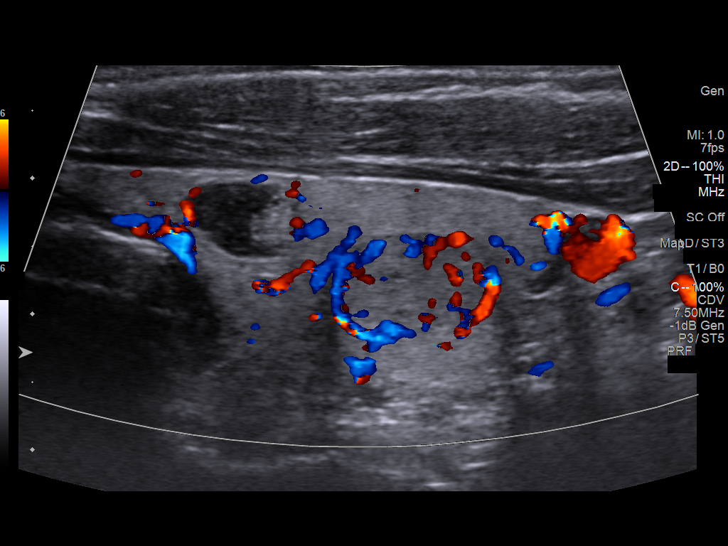
[im 43/47]
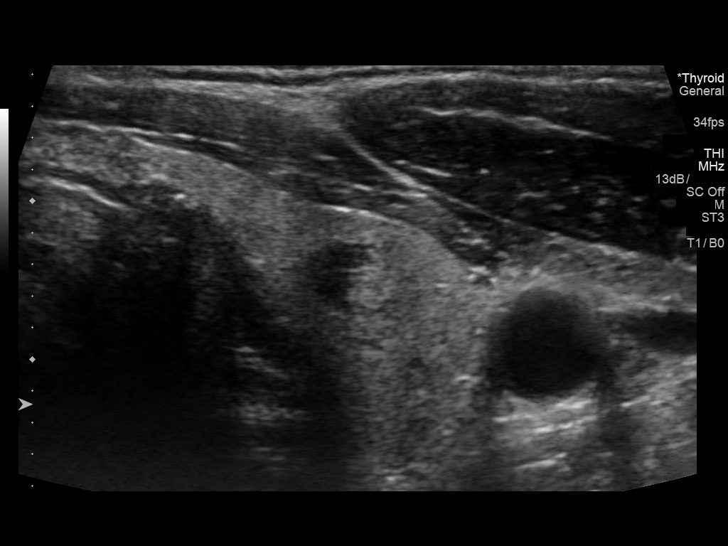
[im 47/47]
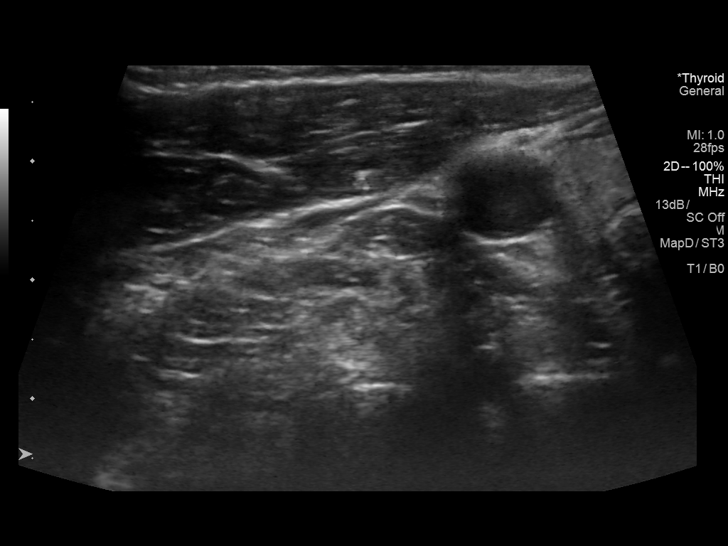

[13 of 25 positions shown; findings below may reference images not displayed]

FINDINGS: Parenchymal Echotexture: Mildly heterogenous

Isthmus: 0.3 cm

Right lobe: 5.9 x 1.6 x 1.7 cm

Left lobe: 5.6 x 2.0 x 1.5 cm

_________________________________________________________

Estimated total number of nodules >/= 1 cm: 2

Number of spongiform nodules >/=  2 cm not described below (TR1): 0

Number of mixed cystic and solid nodules >/= 1.5 cm not described
below (TR2): 0

_________________________________________________________

Nodule # 2:

Location: Right; Inferior

Maximum size: 1.1 cm; Other 2 dimensions: 0.8 x 0.8 cm

Composition: solid/almost completely solid (2)

Echogenicity: isoechoic (1)

Shape: not taller-than-wide (0)

Margins: ill-defined (0)

Echogenic foci: none (0)

ACR TI-RADS total points: 3.

ACR TI-RADS risk category: TR3 (3 points).

ACR TI-RADS recommendations:

Given size (<1.4 cm) and appearance, this nodule does NOT meet
TI-RADS criteria for biopsy or dedicated follow-up.

_________________________________________________________

Nodule # 4:

Location: Left; Inferior

Maximum size: 1.3 cm; Other 2 dimensions: 1.0 x 1.0 cm

Composition: solid/almost completely solid (2)

Echogenicity: isoechoic (1)

Shape: not taller-than-wide (0)

Margins: ill-defined (0)

Echogenic foci: none (0)

ACR TI-RADS total points: 3.

ACR TI-RADS risk category: TR3 (3 points).

ACR TI-RADS recommendations:

Given size (<1.4 cm) and appearance, this nodule does NOT meet
TI-RADS criteria for biopsy or dedicated follow-up.

_________________________________________________________

Small subcentimeter cyst in the left upper gland. This may represent
the residua of the previously drained thyroid cyst.
IMPRESSION: 1. Multiple bilateral thyroid nodules, none of which meet criteria
for biopsy or further imaging follow-up.
2. No further evaluation required.

The above is in keeping with the ACR TI-RADS recommendations - [HOSPITAL] 4476;[DATE].

## 2020-03-13 ENCOUNTER — Encounter: Payer: 59 | Admitting: Family Medicine

## 2020-03-16 ENCOUNTER — Other Ambulatory Visit: Payer: Self-pay

## 2020-03-17 ENCOUNTER — Encounter: Payer: Self-pay | Admitting: Family Medicine

## 2020-03-17 ENCOUNTER — Ambulatory Visit (INDEPENDENT_AMBULATORY_CARE_PROVIDER_SITE_OTHER): Payer: 59 | Admitting: Family Medicine

## 2020-03-17 VITALS — BP 117/69 | HR 64 | Temp 97.5°F | Resp 16 | Ht 69.0 in | Wt 162.2 lb

## 2020-03-17 DIAGNOSIS — N401 Enlarged prostate with lower urinary tract symptoms: Secondary | ICD-10-CM

## 2020-03-17 DIAGNOSIS — Z8639 Personal history of other endocrine, nutritional and metabolic disease: Secondary | ICD-10-CM

## 2020-03-17 DIAGNOSIS — Z0000 Encounter for general adult medical examination without abnormal findings: Secondary | ICD-10-CM

## 2020-03-17 DIAGNOSIS — I10 Essential (primary) hypertension: Secondary | ICD-10-CM

## 2020-03-17 DIAGNOSIS — Z125 Encounter for screening for malignant neoplasm of prostate: Secondary | ICD-10-CM

## 2020-03-17 DIAGNOSIS — N138 Other obstructive and reflux uropathy: Secondary | ICD-10-CM | POA: Diagnosis not present

## 2020-03-17 LAB — CBC WITH DIFFERENTIAL/PLATELET
Basophils Absolute: 0 10*3/uL (ref 0.0–0.1)
Basophils Relative: 0.5 % (ref 0.0–3.0)
Eosinophils Absolute: 0.1 10*3/uL (ref 0.0–0.7)
Eosinophils Relative: 3.3 % (ref 0.0–5.0)
HCT: 40.2 % (ref 39.0–52.0)
Hemoglobin: 13.8 g/dL (ref 13.0–17.0)
Lymphocytes Relative: 34.5 % (ref 12.0–46.0)
Lymphs Abs: 1.4 10*3/uL (ref 0.7–4.0)
MCHC: 34.3 g/dL (ref 30.0–36.0)
MCV: 95 fl (ref 78.0–100.0)
Monocytes Absolute: 0.3 10*3/uL (ref 0.1–1.0)
Monocytes Relative: 7.2 % (ref 3.0–12.0)
Neutro Abs: 2.3 10*3/uL (ref 1.4–7.7)
Neutrophils Relative %: 54.5 % (ref 43.0–77.0)
Platelets: 174 10*3/uL (ref 150.0–400.0)
RBC: 4.23 Mil/uL (ref 4.22–5.81)
RDW: 12.4 % (ref 11.5–15.5)
WBC: 4.1 10*3/uL (ref 4.0–10.5)

## 2020-03-17 LAB — COMPREHENSIVE METABOLIC PANEL
ALT: 19 U/L (ref 0–53)
AST: 23 U/L (ref 0–37)
Albumin: 4.2 g/dL (ref 3.5–5.2)
Alkaline Phosphatase: 43 U/L (ref 39–117)
BUN: 13 mg/dL (ref 6–23)
CO2: 31 mEq/L (ref 19–32)
Calcium: 9.2 mg/dL (ref 8.4–10.5)
Chloride: 101 mEq/L (ref 96–112)
Creatinine, Ser: 0.85 mg/dL (ref 0.40–1.50)
GFR: 93.68 mL/min (ref >60.00)
Glucose, Bld: 85 mg/dL (ref 70–99)
Potassium: 3.8 mEq/L (ref 3.5–5.1)
Sodium: 136 mEq/L (ref 135–145)
Total Bilirubin: 1.1 mg/dL (ref 0.2–1.2)
Total Protein: 6.9 g/dL (ref 6.0–8.3)

## 2020-03-17 LAB — PSA: PSA: 0.4 ng/mL (ref 0.10–4.00)

## 2020-03-17 LAB — LIPID PANEL
Cholesterol: 163 mg/dL (ref 0–200)
HDL: 64.8 mg/dL (ref >39.00)
LDL Cholesterol: 80 mg/dL (ref 0–99)
NonHDL: 98.52
Total CHOL/HDL Ratio: 3
Triglycerides: 95 mg/dL (ref 0.0–149.0)
VLDL: 19 mg/dL (ref 0.0–40.0)

## 2020-03-17 LAB — TSH: TSH: 1.64 u[IU]/mL (ref 0.35–4.50)

## 2020-03-17 NOTE — Patient Instructions (Signed)

## 2020-03-17 NOTE — Progress Notes (Signed)
Office Note 03/17/2020  CC:  Chief Complaint  Patient presents with  . Annual Exam    Pt is fasting   HPI:  Joseph Friedman is a 60 y.o. male who is here for annual health maintenance exam and f/u BPH and HTN.  Home bp's 130s consistently.  Past Medical History:  Diagnosis Date  . BPH with obstruction/lower urinary tract symptoms 2020   flomax helping  . History of adenomatous polyp of colon 2013; 07/2016   Recall 5 yrs  . History of vitamin D deficiency   . Hypertension    since 1990s  . Lipoma of arm    confirmed by MRI in remote past.  . Thyroid nodule 05/08/2017   History of benign thyroid cyst.  Repeated specialist f/u showed all was fine, no recurrence, has always been euthyroid. Dr. Laureen Abrahams f/u 05/2017 showed multiple bilat nodules but none met criteria for bx--no further testing needed.    Past Surgical History:  Procedure Laterality Date  . COLONOSCOPY  02/18/2011; 07/26/16   2013; Adenomatous polyps+; 2018 + polyps--recall 07/2021.  Marland Kitchen POLYPECTOMY      Family History  Problem Relation Age of Onset  . Hypertension Mother   . Thyroid disease Mother   . Heart disease Father   . Thyroid disease Sister   . Heart attack Brother   . Colon cancer Neg Hx   . Colon polyps Neg Hx   . Esophageal cancer Neg Hx   . Rectal cancer Neg Hx   . Stomach cancer Neg Hx     Social History   Socioeconomic History  . Marital status: Married    Spouse name: Not on file  . Number of children: Not on file  . Years of education: Not on file  . Highest education level: Not on file  Occupational History  . Not on file  Tobacco Use  . Smoking status: Never Smoker  . Smokeless tobacco: Never Used  Substance and Sexual Activity  . Alcohol use: Yes    Comment: socially   . Drug use: No  . Sexual activity: Not on file  Other Topics Concern  . Not on file  Social History Narrative   Married, one adult daughter.   Lives in [place].  Nationality: [place], relocated to  [place] in the 1970s.   Ed: College grad.   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   No tobacco or drugs.   Alcohol: social.   Exercises at least 3X/week.   Social Determinants of Health   Financial Resource Strain: Not on file  Food Insecurity: Not on file  Transportation Needs: Not on file  Physical Activity: Not on file  Stress: Not on file  Social Connections: Not on file  Intimate Partner Violence: Not on file    Outpatient Medications Prior to Visit  Medication Sig Dispense Refill  . atenolol (TENORMIN) 25 MG tablet Take 1 tablet (25 mg total) by mouth daily. 90 tablet 3  . B Complex-C (SUPER B COMPLEX PO) Take by mouth daily. Taking 4 days a week    . cholecalciferol (VITAMIN D) 1000 UNITS tablet Take 2,000 Units by mouth daily.    Marland Kitchen co-enzyme Q-10 30 MG capsule Take 10 mg by mouth daily.    . Multiple Vitamin (MULTIVITAMIN) tablet Take 1 tablet by mouth daily.    . Omega-3 Fatty Acids (FISH OIL) 1000 MG CAPS Take 1 capsule by mouth daily.    . tamsulosin (FLOMAX) 0.4 MG CAPS capsule  Take 1 capsule (0.4 mg total) by mouth daily. 90 capsule 3   No facility-administered medications prior to visit.    Allergies  Allergen Reactions  . Augmentin [Amoxicillin-Pot Clavulanate] Diarrhea  . Ditropan [Oxybutynin] Rash    ROS Review of Systems  Constitutional: Negative for appetite change, chills, fatigue and fever.  HENT: Negative for congestion, dental problem, ear pain and sore throat.   Eyes: Negative for discharge, redness and visual disturbance.  Respiratory: Negative for cough, chest tightness, shortness of breath and wheezing.   Cardiovascular: Negative for chest pain, palpitations and leg swelling.  Gastrointestinal: Negative for abdominal pain, blood in stool, diarrhea, nausea and vomiting.  Genitourinary: Negative for difficulty urinating, dysuria, flank pain, frequency, hematuria and urgency.  Musculoskeletal: Negative for arthralgias,  back pain, joint swelling, myalgias and neck stiffness.  Skin: Negative for pallor and rash.  Neurological: Negative for dizziness, speech difficulty, weakness and headaches.  Hematological: Negative for adenopathy. Does not bruise/bleed easily.  Psychiatric/Behavioral: Negative for confusion and sleep disturbance. The patient is not nervous/anxious.    PE; Vitals with BMI 03/17/2020 11/21/2019 10/21/2019  Height '5\' 9"'  '5\' 10"'  '5\' 10"'   Weight 162 lbs 3 oz 165 lbs 3 oz 161 lbs 13 oz  BMI 23.94 35.4 65.68  Systolic 127 517 001  Diastolic 69 69 77  Pulse 64 71 57   Gen: Alert, well appearing.  Patient is oriented to person, place, time, and situation. AFFECT: pleasant, lucid thought and speech. ENT: Ears: EACs clear, normal epithelium.  TMs with good light reflex and landmarks bilaterally.  Eyes: no injection, icteris, swelling, or exudate.  EOMI, PERRLA. Nose: no drainage or turbinate edema/swelling.  No injection or focal lesion.  Mouth: lips without lesion/swelling.  Oral mucosa pink and moist.  Dentition intact and without obvious caries or gingival swelling.  Oropharynx without erythema, exudate, or swelling.  Neck: supple/nontender.  No LAD, mass, or TM.  Carotid pulses 2+ bilaterally, without bruits. CV: RRR, no m/r/g.   LUNGS: CTA bilat, nonlabored resps, good aeration in all lung fields. ABD: soft, NT, ND, BS normal.  No hepatospenomegaly or mass.  No bruits. EXT: no clubbing, cyanosis, or edema.  Musculoskeletal: no joint swelling, erythema, warmth, or tenderness.  ROM of all joints intact. Skin - no sores or suspicious lesions or rashes or color changes   Pertinent labs:  Lab Results  Component Value Date   TSH 1.70 03/22/2017   Lab Results  Component Value Date   WBC 4.7 03/13/2019   HGB 14.0 03/13/2019   HCT 42.2 03/13/2019   MCV 96.5 03/13/2019   PLT 185.0 03/13/2019   Lab Results  Component Value Date   CREATININE 0.87 03/13/2019   BUN 15 03/13/2019   NA 137  03/13/2019   K 4.0 03/13/2019   CL 101 03/13/2019   CO2 30 03/13/2019   Lab Results  Component Value Date   ALT 20 03/13/2019   AST 23 03/13/2019   ALKPHOS 45 03/13/2019   BILITOT 1.1 03/13/2019   Lab Results  Component Value Date   CHOL 179 03/13/2019   Lab Results  Component Value Date   HDL 63.50 03/13/2019   Lab Results  Component Value Date   LDLCALC 97 03/13/2019   Lab Results  Component Value Date   TRIG 94.0 03/13/2019   Lab Results  Component Value Date   CHOLHDL 3 03/13/2019   Lab Results  Component Value Date   PSA 0.46 03/13/2019   PSA 0.42 02/22/2018  PSA 0.38 03/22/2017   ASSESSMENT AND PLAN:   1) HTN: great bp's here, home systolics 315X.  No changes->cont atenolol 72m qd. He is considering getting new bp cuff for home monitoring. Lytes/cr today.  2) BPH with LUT obst sxs: tamsulosin 0.454mqd working well.  3) Health maintenance exam: Reviewed age and gender appropriate health maintenance issues (prudent diet, regular exercise, health risks of tobacco and excessive alcohol, use of seatbelts, fire alarms in home, use of sunscreen).  Also reviewed age and gender appropriate health screening as well as vaccine recommendations. Vaccines: ALL UTD. Labs: fasting HP + PSA. Prostate ca screening:  PSA today. Colon ca screening: recall 07/2021.  An After Visit Summary was printed and given to the patient.  FOLLOW UP:  Return in about 1 year (around 03/17/2021) for annual CPE (fasting).  Signed:  PhCrissie SicklesMD           03/17/2020

## 2020-04-06 ENCOUNTER — Telehealth: Payer: Self-pay

## 2020-04-06 NOTE — Telephone Encounter (Signed)
Patient returned from [place] last [place] 3/17; he had a COVID test on Thursday it was negative. He had no symptoms. Now, he has symptoms: runny nose,tired, slight head congestion,stratchy throat.  He tested himself with a HOME test and it is POSITIVE. What is Dr. Anitra Lauth recommending to do?  He took some Ibprofen earlier for headache and it is helping.  Does dr. Anitra Lauth prescribe med to help with COVID?  Please call [phone]

## 2020-04-06 NOTE — Telephone Encounter (Signed)
Spoke with patient regarding recommendations. Pt voiced understanding

## 2020-04-06 NOTE — Telephone Encounter (Signed)
Nothing additional to recommend.

## 2020-04-06 NOTE — Telephone Encounter (Signed)
Treat symptoms symptomatically with OTC meds. Hydrate well, rest. Quarantine for 14 days from the onset of symptoms.  Must be feeling significant improvement in symptoms and have no fever for the last 3d of quarantine before ending the quarantine.   Please advise if other suggestions without appt.

## 2020-04-25 ENCOUNTER — Other Ambulatory Visit: Payer: Self-pay | Admitting: Family Medicine

## 2020-10-21 ENCOUNTER — Other Ambulatory Visit: Payer: Self-pay | Admitting: Family Medicine

## 2020-11-10 ENCOUNTER — Ambulatory Visit: Payer: 59

## 2021-03-19 ENCOUNTER — Encounter: Payer: 59 | Admitting: Family Medicine

## 2021-03-25 ENCOUNTER — Other Ambulatory Visit: Payer: Self-pay

## 2021-03-26 ENCOUNTER — Ambulatory Visit (INDEPENDENT_AMBULATORY_CARE_PROVIDER_SITE_OTHER): Payer: 59 | Admitting: Family Medicine

## 2021-03-26 ENCOUNTER — Encounter: Payer: Self-pay | Admitting: Family Medicine

## 2021-03-26 VITALS — BP 130/67 | HR 69 | Temp 97.5°F | Ht 70.0 in | Wt 161.8 lb

## 2021-03-26 DIAGNOSIS — N400 Enlarged prostate without lower urinary tract symptoms: Secondary | ICD-10-CM | POA: Diagnosis not present

## 2021-03-26 DIAGNOSIS — Z125 Encounter for screening for malignant neoplasm of prostate: Secondary | ICD-10-CM | POA: Diagnosis not present

## 2021-03-26 DIAGNOSIS — Z0000 Encounter for general adult medical examination without abnormal findings: Secondary | ICD-10-CM

## 2021-03-26 DIAGNOSIS — I10 Essential (primary) hypertension: Secondary | ICD-10-CM | POA: Diagnosis not present

## 2021-03-26 LAB — COMPREHENSIVE METABOLIC PANEL
ALT: 18 U/L (ref 0–53)
AST: 22 U/L (ref 0–37)
Albumin: 4.4 g/dL (ref 3.5–5.2)
Alkaline Phosphatase: 43 U/L (ref 39–117)
BUN: 13 mg/dL (ref 6–23)
CO2: 31 mEq/L (ref 19–32)
Calcium: 9.4 mg/dL (ref 8.4–10.5)
Chloride: 100 mEq/L (ref 96–112)
Creatinine, Ser: 0.86 mg/dL (ref 0.40–1.50)
GFR: 92.68 mL/min (ref >60.00)
Glucose, Bld: 81 mg/dL (ref 70–99)
Potassium: 4.1 mEq/L (ref 3.5–5.1)
Sodium: 137 mEq/L (ref 135–145)
Total Bilirubin: 1.2 mg/dL (ref 0.2–1.2)
Total Protein: 6.8 g/dL (ref 6.0–8.3)

## 2021-03-26 LAB — CBC WITH DIFFERENTIAL/PLATELET
Basophils Absolute: 0 10*3/uL (ref 0.0–0.1)
Basophils Relative: 0.6 % (ref 0.0–3.0)
Eosinophils Absolute: 0.2 10*3/uL (ref 0.0–0.7)
Eosinophils Relative: 3.2 % (ref 0.0–5.0)
HCT: 39.5 % (ref 39.0–52.0)
Hemoglobin: 13.2 g/dL (ref 13.0–17.0)
Lymphocytes Relative: 32 % (ref 12.0–46.0)
Lymphs Abs: 1.5 10*3/uL (ref 0.7–4.0)
MCHC: 33.6 g/dL (ref 30.0–36.0)
MCV: 96 fl (ref 78.0–100.0)
Monocytes Absolute: 0.3 10*3/uL (ref 0.1–1.0)
Monocytes Relative: 6.9 % (ref 3.0–12.0)
Neutro Abs: 2.7 10*3/uL (ref 1.4–7.7)
Neutrophils Relative %: 57.3 % (ref 43.0–77.0)
Platelets: 172 10*3/uL (ref 150.0–400.0)
RBC: 4.11 Mil/uL — ABNORMAL LOW (ref 4.22–5.81)
RDW: 12.4 % (ref 11.5–15.5)
WBC: 4.8 10*3/uL (ref 4.0–10.5)

## 2021-03-26 LAB — LIPID PANEL
Cholesterol: 174 mg/dL (ref 0–200)
HDL: 61.3 mg/dL (ref >39.00)
LDL Cholesterol: 97 mg/dL (ref 0–99)
NonHDL: 112.36
Total CHOL/HDL Ratio: 3
Triglycerides: 79 mg/dL (ref 0.0–149.0)
VLDL: 15.8 mg/dL (ref 0.0–40.0)

## 2021-03-26 LAB — PSA: PSA: 0.29 ng/mL (ref 0.10–4.00)

## 2021-03-26 LAB — TSH: TSH: 1.38 u[IU]/mL (ref 0.35–5.50)

## 2021-03-26 MED ORDER — TAMSULOSIN HCL 0.4 MG PO CAPS
0.4000 mg | ORAL_CAPSULE | Freq: Every day | ORAL | 3 refills | Status: DC
Start: 2021-03-26 — End: 2022-05-26

## 2021-03-26 MED ORDER — ATENOLOL 25 MG PO TABS: 25.0000 mg | ORAL_TABLET | Freq: Every day | ORAL | 3 refills | Status: DC

## 2021-03-26 NOTE — Patient Instructions (Signed)

## 2021-03-26 NOTE — Progress Notes (Signed)
Office Note 03/26/2021  CC:  Chief Complaint  Patient presents with   Annual Exam    Pt is fasting   Patient is a 61 y.o. male who is here for annual health maintenance exam and f/u BPH and HTN. A/P as of last visit: "1) HTN: great bp's here, home systolics 517G.  No changes->cont atenolol 73m qd. He is considering getting new bp cuff for home monitoring. Lytes/cr today.   2) BPH with LUT obst sxs: tamsulosin 0.415mqd working well.   3) Health maintenance exam: Reviewed age and gender appropriate health maintenance issues (prudent diet, regular exercise, health risks of tobacco and excessive alcohol, use of seatbelts, fire alarms in home, use of sunscreen).  Also reviewed age and gender appropriate health screening as well as vaccine recommendations. Vaccines: ALL UTD. Labs: fasting HP + PSA. Prostate ca screening:  PSA today. Colon ca screening: recall 07/2021."  INTERIM HX: Joseph Friedman feels good. Home blood pressures consistently in the 12017Cystolic, sometimes up to 132. Diastolics always in the 7094WHeart rate 60s to 70s typically.    Past Medical History:  Diagnosis Date   BPH with obstruction/lower urinary tract symptoms 2020   flomax helping   History of adenomatous polyp of colon 2013; 07/2016   Recall 5 yrs   History of vitamin D deficiency    Hypertension    since 1990s   Lipoma of arm    confirmed by MRI in remote past.   Thyroid nodule 05/08/2017   History of benign thyroid cyst.  Repeated specialist f/u showed all was fine, no recurrence, has always been euthyroid. Dr. KuLaureen Abrahams/u 05/2017 showed multiple bilat nodules but none met criteria for bx--no further testing needed.    Past Surgical History:  Procedure Laterality Date   COLONOSCOPY  02/18/2011; 07/26/16   2013; Adenomatous polyps+; 2018 + polyps--recall 07/2021.   POLYPECTOMY      Family History  Problem Relation Age of Onset   Hypertension Mother    Thyroid disease Mother    Heart disease  Father    Thyroid disease Sister    Heart attack Brother    Colon cancer Neg Hx    Colon polyps Neg Hx    Esophageal cancer Neg Hx    Rectal cancer Neg Hx    Stomach cancer Neg Hx     Social History   Socioeconomic History   Marital status: Married    Spouse name: Not on file   Number of children: Not on file   Years of education: Not on file   Highest education level: Not on file  Occupational History   Not on file  Tobacco Use   Smoking status: Never   Smokeless tobacco: Never  Substance and Sexual Activity   Alcohol use: Yes    Comment: socially    Drug use: No   Sexual activity: Not on file  Other Topics Concern   Not on file  Social History Narrative   Married, one adult daughter.   [place]   Ed: College grad.   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   Alcohol: social.   Exercises at least 3X/week.   Social Determinants of Health   Financial Resource Strain: Not on file  Food Insecurity: Not on file  Transportation Needs: Not on file  Physical Activity: Not on file  Stress: Not on file  Social  Connections: Not on file  Intimate Partner Violence: Not on file    Outpatient Medications Prior to Visit  Medication Sig Dispense Refill   B Complex-C (SUPER B COMPLEX PO) Take by mouth daily. Taking 4 days a week     cholecalciferol (VITAMIN D) 1000 UNITS tablet Take 2,000 Units by mouth daily.     Multiple Vitamin (MULTIVITAMIN) tablet Take 1 tablet by mouth daily.     atenolol (TENORMIN) 25 MG tablet TAKE ONE TABLET BY MOUTH ONE TIME DAILY 90 tablet 1   co-enzyme Q-10 30 MG capsule Take 10 mg by mouth daily. (Patient not taking: Reported on 03/26/2021)     Omega-3 Fatty Acids (FISH OIL) 1000 MG CAPS Take 1 capsule by mouth daily. (Patient not taking: Reported on 03/26/2021)     tamsulosin (FLOMAX) 0.4 MG CAPS capsule TAKE ONE CAPSULE BY MOUTH ONE  TIME DAILY 90 capsule 1   No facility-administered medications prior to visit.    Allergies  Allergen Reactions   Augmentin [Amoxicillin-Pot Clavulanate] Diarrhea   Ditropan [Oxybutynin] Rash    ROS Review of Systems  Constitutional:  Negative for appetite change, chills, fatigue and fever.  HENT:  Negative for congestion, dental problem, ear pain and sore throat.   Eyes:  Negative for discharge, redness and visual disturbance.  Respiratory:  Negative for cough, chest tightness, shortness of breath and wheezing.   Cardiovascular:  Negative for chest pain, palpitations and leg swelling.  Gastrointestinal:  Negative for abdominal pain, blood in stool, diarrhea, nausea and vomiting.  Genitourinary:  Negative for difficulty urinating, dysuria, flank pain, frequency, hematuria and urgency.  Musculoskeletal:  Negative for arthralgias, back pain, joint swelling, myalgias and neck stiffness.  Skin:  Negative for pallor and rash.  Neurological:  Negative for dizziness, speech difficulty, weakness and headaches.  Hematological:  Negative for adenopathy. Does not bruise/bleed easily.  Psychiatric/Behavioral:  Negative for confusion and sleep disturbance. The patient is not nervous/anxious.    PE; Vitals with BMI 03/26/2021 03/17/2020 11/21/2019  Height '5\' 10"'  '5\' 9"'  '5\' 10"'   Weight 161 lbs 13 oz 162 lbs 3 oz 165 lbs 3 oz  BMI 23.22 50.03 70.4  Systolic 888 916 945  Diastolic 67 69 69  Pulse 69 64 71   Gen: Alert, well appearing.  Patient is oriented to person, place, time, and situation. AFFECT: pleasant, lucid thought and speech. ENT: Ears: EACs clear, normal epithelium.  TMs with good light reflex and landmarks bilaterally.  Eyes: no injection, icteris, swelling, or exudate.  EOMI, PERRLA. Nose: no drainage or turbinate edema/swelling.  No injection or focal lesion.  Mouth: lips without lesion/swelling.  Oral mucosa pink and moist.  Dentition intact and without obvious caries or gingival  swelling.  Oropharynx without erythema, exudate, or swelling.  Neck: supple/nontender.  No LAD, mass, or TM.  Carotid pulses 2+ bilaterally, without bruits. CV: RRR, no m/r/g.   LUNGS: CTA bilat, nonlabored resps, good aeration in all lung fields. ABD: soft, NT, ND, BS normal.  No hepatospenomegaly or mass.  No bruits. EXT: no clubbing, cyanosis, or edema.  Musculoskeletal: no joint swelling, erythema, warmth, or tenderness.  ROM of all joints intact. Skin - no sores or suspicious lesions or rashes or color changes  Pertinent labs:  Lab Results  Component Value Date   TSH 1.64 03/17/2020   Lab Results  Component Value Date   WBC 4.1 03/17/2020   HGB 13.8 03/17/2020   HCT 40.2 03/17/2020   MCV 95.0 03/17/2020  PLT 174.0 03/17/2020   Lab Results  Component Value Date   CREATININE 0.85 03/17/2020   BUN 13 03/17/2020   NA 136 03/17/2020   K 3.8 03/17/2020   CL 101 03/17/2020   CO2 31 03/17/2020   Lab Results  Component Value Date   ALT 19 03/17/2020   AST 23 03/17/2020   ALKPHOS 43 03/17/2020   BILITOT 1.1 03/17/2020   Lab Results  Component Value Date   CHOL 163 03/17/2020   Lab Results  Component Value Date   HDL 64.80 03/17/2020   Lab Results  Component Value Date   LDLCALC 80 03/17/2020   Lab Results  Component Value Date   TRIG 95.0 03/17/2020   Lab Results  Component Value Date   CHOLHDL 3 03/17/2020   Lab Results  Component Value Date   PSA 0.40 03/17/2020   PSA 0.46 03/13/2019   PSA 0.42 02/22/2018   ASSESSMENT AND PLAN:   1) HTN, good control on atenolol 25 mg a day. Electrolytes and creatinine today. Continue periodic home blood pressure monitoring.  2.  BPH with lower urinary tract obstructive symptoms.  Well-controlled on Flomax 0.4 mg a day.  Health maintenance exam: Reviewed age and gender appropriate health maintenance issues (prudent diet, regular exercise, health risks of tobacco and excessive alcohol, use of seatbelts, fire  alarms in home, use of sunscreen).  Also reviewed age and gender appropriate health screening as well as vaccine recommendations. Vaccines: ALL UTD. Labs: fasting HP + PSA. Prostate ca screening:  PSA today Colon ca screening: recall 07/2021 (digest hea spec).  An After Visit Summary was printed and given to the patient.  FOLLOW UP:  Return in about 1 year (around 03/27/2022) for annual CPE (fasting).  Signed:  Crissie Sickles, MD           03/26/2021

## 2021-09-06 LAB — HM COLONOSCOPY

## 2021-09-22 ENCOUNTER — Ambulatory Visit (INDEPENDENT_AMBULATORY_CARE_PROVIDER_SITE_OTHER): Payer: 59

## 2021-09-22 DIAGNOSIS — Z23 Encounter for immunization: Secondary | ICD-10-CM | POA: Diagnosis not present

## 2022-03-29 ENCOUNTER — Encounter: Payer: 59 | Admitting: Family Medicine

## 2022-04-12 NOTE — Patient Instructions (Signed)
Health Maintenance, Male Adopting a healthy lifestyle and getting preventive care are important in promoting health and wellness. Ask your health care provider about: The right schedule for you to have regular tests and exams. Things you can do on your own to prevent diseases and keep yourself healthy. What should I know about diet, weight, and exercise? Eat a healthy diet  Eat a diet that includes plenty of vegetables, fruits, low-fat dairy products, and lean protein. Do not eat a lot of foods that are high in solid fats, added sugars, or sodium. Maintain a healthy weight Body mass index (BMI) is a measurement that can be used to identify possible weight problems. It estimates body fat based on height and weight. Your health care provider can help determine your BMI and help you achieve or maintain a healthy weight. Get regular exercise Get regular exercise. This is one of the most important things you can do for your health. Most adults should: Exercise for at least 150 minutes each week. The exercise should increase your heart rate and make you sweat (moderate-intensity exercise). Do strengthening exercises at least twice a week. This is in addition to the moderate-intensity exercise. Spend less time sitting. Even light physical activity can be beneficial. Watch cholesterol and blood lipids Have your blood tested for lipids and cholesterol at 64 years of age, then have this test every 5 years. You may need to have your cholesterol levels checked more often if: Your lipid or cholesterol levels are high. You are older than 64 years of age. You are at high risk for heart disease. What should I know about cancer screening? Many types of cancers can be detected early and may often be prevented. Depending on your health history and family history, you may need to have cancer screening at various ages. This may include screening for: Colorectal cancer. Prostate cancer. Skin cancer. Lung  cancer. What should I know about heart disease, diabetes, and high blood pressure? Blood pressure and heart disease High blood pressure causes heart disease and increases the risk of stroke. This is more likely to develop in people who have high blood pressure readings or are overweight. Talk with your health care provider about your target blood pressure readings. Have your blood pressure checked: Every 3-5 years if you are 18-39 years of age. Every year if you are 40 years old or older. If you are between the ages of 65 and 75 and are a current or former smoker, ask your health care provider if you should have a one-time screening for abdominal aortic aneurysm (AAA). Diabetes Have regular diabetes screenings. This checks your fasting blood sugar level. Have the screening done: Once every three years after age 45 if you are at a normal weight and have a low risk for diabetes. More often and at a younger age if you are overweight or have a high risk for diabetes. What should I know about preventing infection? Hepatitis B If you have a higher risk for hepatitis B, you should be screened for this virus. Talk with your health care provider to find out if you are at risk for hepatitis B infection. Hepatitis C Blood testing is recommended for: Everyone born from 1945 through 1965. Anyone with known risk factors for hepatitis C. Sexually transmitted infections (STIs) You should be screened each year for STIs, including gonorrhea and chlamydia, if: You are sexually active and are younger than 64 years of age. You are older than 64 years of age and your   health care provider tells you that you are at risk for this type of infection. Your sexual activity has changed since you were last screened, and you are at increased risk for chlamydia or gonorrhea. Ask your health care provider if you are at risk. Ask your health care provider about whether you are at high risk for HIV. Your health care provider  may recommend a prescription medicine to help prevent HIV infection. If you choose to take medicine to prevent HIV, you should first get tested for HIV. You should then be tested every 3 months for as long as you are taking the medicine. Follow these instructions at home: Alcohol use Do not drink alcohol if your health care provider tells you not to drink. If you drink alcohol: Limit how much you have to 0-2 drinks a day. Know how much alcohol is in your drink. In the [place] one drink equals one 12 oz bottle of beer (355 mL), one 5 oz glass of wine (148 mL), or one 1 oz glass of hard liquor (44 mL). Lifestyle Do not use any products that contain nicotine or tobacco. These products include cigarettes, chewing tobacco, and vaping devices, such as e-cigarettes. If you need help quitting, ask your health care provider. Do not use street drugs. Do not share needles. Ask your health care provider for help if you need support or information about quitting drugs. General instructions Schedule regular health, dental, and eye exams. Stay current with your vaccines. Tell your health care provider if: You often feel depressed. You have ever been abused or do not feel safe at home. Summary Adopting a healthy lifestyle and getting preventive care are important in promoting health and wellness. Follow your health care provider's instructions about healthy diet, exercising, and getting tested or screened for diseases. Follow your health care provider's instructions on monitoring your cholesterol and blood pressure. This information is not intended to replace advice given to you by your health care provider. Make sure you discuss any questions you have with your health care provider. Document Revised: 05/25/2020 Document Reviewed: 05/25/2020 Elsevier Patient Education  2023 [employer]  

## 2022-04-15 ENCOUNTER — Encounter: Payer: 59 | Admitting: Family Medicine

## 2022-04-19 ENCOUNTER — Encounter: Payer: Self-pay | Admitting: Family Medicine

## 2022-04-19 ENCOUNTER — Ambulatory Visit (INDEPENDENT_AMBULATORY_CARE_PROVIDER_SITE_OTHER): Payer: Self-pay | Admitting: Family Medicine

## 2022-04-19 VITALS — BP 129/77 | HR 62 | Ht 69.0 in | Wt 160.4 lb

## 2022-04-19 DIAGNOSIS — I10 Essential (primary) hypertension: Secondary | ICD-10-CM

## 2022-04-19 DIAGNOSIS — Z125 Encounter for screening for malignant neoplasm of prostate: Secondary | ICD-10-CM | POA: Diagnosis not present

## 2022-04-19 DIAGNOSIS — Z0000 Encounter for general adult medical examination without abnormal findings: Secondary | ICD-10-CM

## 2022-04-19 DIAGNOSIS — E559 Vitamin D deficiency, unspecified: Secondary | ICD-10-CM

## 2022-04-19 LAB — PSA: PSA: 0.45 ng/mL (ref 0.10–4.00)

## 2022-04-19 LAB — COMPREHENSIVE METABOLIC PANEL
ALT: 21 U/L (ref 0–53)
AST: 24 U/L (ref 0–37)
Albumin: 4.4 g/dL (ref 3.5–5.2)
Alkaline Phosphatase: 47 U/L (ref 39–117)
BUN: 13 mg/dL (ref 6–23)
CO2: 30 mEq/L (ref 19–32)
Calcium: 9.2 mg/dL (ref 8.4–10.5)
Chloride: 100 mEq/L (ref 96–112)
Creatinine, Ser: 0.84 mg/dL (ref 0.40–1.50)
GFR: 92.64 mL/min (ref >60.00)
Glucose, Bld: 90 mg/dL (ref 70–99)
Potassium: 4 mEq/L (ref 3.5–5.1)
Sodium: 135 mEq/L (ref 135–145)
Total Bilirubin: 1 mg/dL (ref 0.2–1.2)
Total Protein: 7.2 g/dL (ref 6.0–8.3)

## 2022-04-19 LAB — CBC
HCT: 41.9 % (ref 39.0–52.0)
Hemoglobin: 14.2 g/dL (ref 13.0–17.0)
MCHC: 33.9 g/dL (ref 30.0–36.0)
MCV: 95.8 fl (ref 78.0–100.0)
Platelets: 183 10*3/uL (ref 150.0–400.0)
RBC: 4.37 Mil/uL (ref 4.22–5.81)
RDW: 12.3 % (ref 11.5–15.5)
WBC: 4.7 10*3/uL (ref 4.0–10.5)

## 2022-04-19 LAB — LIPID PANEL
Cholesterol: 192 mg/dL (ref 0–200)
HDL: 69.3 mg/dL (ref >39.00)
LDL Cholesterol: 109 mg/dL — ABNORMAL HIGH (ref 0–99)
NonHDL: 122.71
Total CHOL/HDL Ratio: 3
Triglycerides: 71 mg/dL (ref 0.0–149.0)
VLDL: 14.2 mg/dL (ref 0.0–40.0)

## 2022-04-19 LAB — VITAMIN D 25 HYDROXY (VIT D DEFICIENCY, FRACTURES): VITD: 50.09 ng/mL (ref 30.00–100.00)

## 2022-04-19 LAB — TSH: TSH: 1.23 u[IU]/mL (ref 0.35–5.50)

## 2022-04-19 MED ORDER — ATENOLOL 50 MG PO TABS: 50.0000 mg | ORAL_TABLET | Freq: Every day | ORAL | 3 refills | Status: DC

## 2022-04-19 NOTE — Progress Notes (Signed)
Office Note 04/19/2022  CC:  Chief Complaint  Patient presents with   Annual Exam    Fasting CPE.   Patient is a 62 y.o. male who is here for annual health maintenance exam and hypertension.. A/P as of last visit 1 year ago: "Health maintenance exam: Reviewed age and gender appropriate health maintenance issues (prudent diet, regular exercise, health risks of tobacco and excessive alcohol, use of seatbelts, fire alarms in home, use of sunscreen).  Also reviewed age and gender appropriate health screening as well as vaccine recommendations. Vaccines: ALL UTD. Labs: fasting HP + PSA. Prostate ca screening:  PSA today Colon ca screening: recall 07/2021 (digest hea spec)".  INTERIM HX: Joseph Friedman feels well. He has blood pressure measurements from the last couple of months and they do show a little higher average than he has had the last several years. [id number] systolic, mostly [id number] diastolic.  25 mg atenolol daily.   Past Medical History:  Diagnosis Date   BPH with obstruction/lower urinary tract symptoms 2020   flomax helping   Diverticulosis    History of adenomatous polyp of colon 2013; 07/2016   09/06/21 tubular adenoma x 1.  Recall 5 yrs   History of vitamin D deficiency    Hypertension    since 1990s   Lipoma of arm    confirmed by MRI in remote past.   Thyroid nodule 05/08/2017   History of benign thyroid cyst.  Repeated specialist f/u showed all was fine, no recurrence, has always been euthyroid. Dr. Laureen Abrahams f/u 05/2017 showed multiple bilat nodules but none met criteria for bx--no further testing needed.    Past Surgical History:  Procedure Laterality Date   COLONOSCOPY  02/18/2011; 07/26/16   2013; Adenomatous polyps+; 2018 + polyps. 09/06/21 adenoma x 1-->recall 5 yrs.   POLYPECTOMY      Family History  Problem Relation Age of Onset   Hypertension Mother    Thyroid disease Mother    Heart disease Father    Thyroid disease Sister    Heart attack Brother     Colon cancer Neg Hx    Colon polyps Neg Hx    Esophageal cancer Neg Hx    Rectal cancer Neg Hx    Stomach cancer Neg Hx     Social History   Socioeconomic History   Marital status: Married    Spouse name: Not on file   Number of children: Not on file   Years of education: Not on file   Highest education level: Not on file  Occupational History   Not on file  Tobacco Use   Smoking status: Never   Smokeless tobacco: Never  Substance and Sexual Activity   Alcohol use: Yes    Comment: socially    Drug use: No   Sexual activity: Not on file  Other Topics Concern   Not on file  Social History Narrative   Married, one adult daughter.   Lives in [place].  Nationality: [place], relocated to [place] in the 1970s.   Ed: College grad.   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   No tobacco or drugs.   Alcohol: social.   Exercises at least 3X/week.   Social Determinants of Health   Financial Resource Strain: Not on file  Food Insecurity: Not on file  Transportation Needs: Not on file  Physical Activity: Not on file  Stress: Not on file  Social Connections: Not on file  Intimate Partner Violence: Not on file  Outpatient Medications Prior to Visit  Medication Sig Dispense Refill   B Complex-C (SUPER B COMPLEX PO) Take by mouth daily. Taking 4 days a week     cholecalciferol (VITAMIN D) 1000 UNITS tablet Take 2,000 Units by mouth daily.     Multiple Vitamin (MULTIVITAMIN) tablet Take 1 tablet by mouth daily.     tamsulosin (FLOMAX) 0.4 MG CAPS capsule Take 1 capsule (0.4 mg total) by mouth daily. 90 capsule 3   atenolol (TENORMIN) 25 MG tablet Take 1 tablet (25 mg total) by mouth daily. 90 tablet 3   No facility-administered medications prior to visit.    Allergies  Allergen Reactions   Augmentin [Amoxicillin-Pot Clavulanate] Diarrhea   Ditropan [Oxybutynin] Rash    Review of Systems  Constitutional:  Negative for appetite change, chills,  fatigue and fever.  HENT:  Negative for congestion, dental problem, ear pain and sore throat.   Eyes:  Negative for discharge, redness and visual disturbance.  Respiratory:  Negative for cough, chest tightness, shortness of breath and wheezing.   Cardiovascular:  Negative for chest pain, palpitations and leg swelling.  Gastrointestinal:  Negative for abdominal pain, blood in stool, diarrhea, nausea and vomiting.  Genitourinary:  Negative for difficulty urinating, dysuria, flank pain, frequency, hematuria and urgency.  Musculoskeletal:  Negative for arthralgias, back pain, joint swelling, myalgias and neck stiffness.  Skin:  Negative for pallor and rash.  Neurological:  Negative for dizziness, speech difficulty, weakness and headaches.  Hematological:  Negative for adenopathy. Does not bruise/bleed easily.  Psychiatric/Behavioral:  Negative for confusion and sleep disturbance. The patient is not nervous/anxious.     PE;    04/19/2022   10:25 AM 03/26/2021    8:48 AM 03/17/2020    8:54 AM  Vitals with BMI  Height 5\' 9"  5\' 10"  5\' 9"   Weight 160 lbs 6 oz 161 lbs 13 oz 162 lbs 3 oz  BMI 23.68 [id number]  Systolic [id number]  Diastolic 77 67 69  Pulse 62 69 64   Gen: Alert, well appearing.  Patient is oriented to person, place, time, and situation. AFFECT: pleasant, lucid thought and speech. ENT: Ears: EACs clear, normal epithelium.  TMs with good light reflex and landmarks bilaterally.  Eyes: no injection, icteris, swelling, or exudate.  EOMI, PERRLA. Nose: no drainage or turbinate edema/swelling.  No injection or focal lesion.  Mouth: lips without lesion/swelling.  Oral mucosa pink and moist.  Dentition intact and without obvious caries or gingival swelling.  Oropharynx without erythema, exudate, or swelling.  Neck: supple/nontender.  No LAD, mass, or TM.  Carotid pulses 2+ bilaterally, without bruits. CV: RRR, no m/r/g.   LUNGS: CTA bilat, nonlabored resps, good aeration in all lung  fields. ABD: soft, NT, ND, BS normal.  No hepatospenomegaly or mass.  No bruits. EXT: no clubbing, cyanosis, or edema.  Right arm with moderate size lipomatous mass in antecubital fossa. Musculoskeletal: no joint swelling, erythema, warmth, or tenderness.  ROM of all joints intact. Skin - no sores or suspicious lesions or rashes or color changes  Pertinent labs:  Lab Results  Component Value Date   TSH 1.38 03/26/2021   Lab Results  Component Value Date   WBC 4.8 03/26/2021   HGB 13.2 03/26/2021   HCT 39.5 03/26/2021   MCV 96.0 03/26/2021   PLT 172.0 03/26/2021   Lab Results  Component Value Date   CREATININE 0.86 03/26/2021   BUN 13 03/26/2021   NA 137 03/26/2021   K  4.1 03/26/2021   CL 100 03/26/2021   CO2 31 03/26/2021   Lab Results  Component Value Date   ALT 18 03/26/2021   AST 22 03/26/2021   ALKPHOS 43 03/26/2021   BILITOT 1.2 03/26/2021   Lab Results  Component Value Date   CHOL 174 03/26/2021   Lab Results  Component Value Date   HDL 61.30 03/26/2021   Lab Results  Component Value Date   LDLCALC 97 03/26/2021   Lab Results  Component Value Date   TRIG 79.0 03/26/2021   Lab Results  Component Value Date   CHOLHDL 3 03/26/2021   Lab Results  Component Value Date   PSA 0.29 03/26/2021   PSA 0.40 03/17/2020   PSA 0.46 03/13/2019   ASSESSMENT AND PLAN:   #1 health maintenance exam: Reviewed age and gender appropriate health maintenance issues (prudent diet, regular exercise, health risks of tobacco and excessive alcohol, use of seatbelts, fire alarms in home, use of sunscreen).  Also reviewed age and gender appropriate health screening as well as vaccine recommendations. Vaccines: ALL UTD. Labs: fasting HP + PSA. Prostate ca screening:  PSA today Colon ca screening: hx adenomas, most recent colonoscopy 08/2021, recall approx 08/2026.  #2 uncontrolled hypertension.  Increase atenolol to 50 mg daily. Continue to monitor blood pressure at home and  we will review these in the office in 1 month.  #3 right arm lipoma, asymptomatic.  This has not grown any over the recent years.  Reassured.  #4 history of vitamin D deficiency.  Check vitamin D level today.  He is taking 1000 unit vitamin D daily.  An After Visit Summary was printed and given to the patient.  FOLLOW UP:  Return in about 4 weeks (around 05/17/2022) for f/u HTN.  Signed:  Crissie Sickles, MD           04/19/2022

## 2022-05-26 ENCOUNTER — Other Ambulatory Visit: Payer: Self-pay

## 2022-05-26 ENCOUNTER — Other Ambulatory Visit: Payer: Self-pay | Admitting: Family Medicine

## 2022-05-26 MED ORDER — TAMSULOSIN HCL 0.4 MG PO CAPS: 0.4000 mg | ORAL_CAPSULE | Freq: Every day | ORAL | 0 refills | Status: DC

## 2022-05-26 NOTE — Telephone Encounter (Signed)
Patient came to office to speak to Dr. Milinda Cave about medication refill for 30 d/s.  He states he gets a 90 d/s of this medication. He says there is a mistake in "our system".  His upcoming appt has nothing to do with blood pressure follow up. Patient wants call back to explain why he was denied. He did not pick up 30 d/s.  He is out of medication.  Please call [phone]

## 2022-05-30 ENCOUNTER — Encounter: Payer: 59 | Admitting: Family Medicine

## 2022-05-30 ENCOUNTER — Ambulatory Visit: Payer: 59 | Admitting: Family Medicine

## 2022-06-09 NOTE — Patient Instructions (Signed)

## 2022-06-15 ENCOUNTER — Ambulatory Visit (INDEPENDENT_AMBULATORY_CARE_PROVIDER_SITE_OTHER): Payer: 59 | Admitting: Family Medicine

## 2022-06-15 ENCOUNTER — Encounter: Payer: Self-pay | Admitting: Family Medicine

## 2022-06-15 VITALS — BP 119/73 | HR 75 | Wt 163.8 lb

## 2022-06-15 DIAGNOSIS — I10 Essential (primary) hypertension: Secondary | ICD-10-CM | POA: Diagnosis not present

## 2022-06-15 MED ORDER — TAMSULOSIN HCL 0.4 MG PO CAPS: 0.4000 mg | ORAL_CAPSULE | Freq: Every day | ORAL | 0 refills | Status: DC

## 2022-06-15 MED ORDER — TAMSULOSIN HCL 0.4 MG PO CAPS: 0.4000 mg | ORAL_CAPSULE | Freq: Every day | ORAL | 3 refills | Status: DC

## 2022-06-15 NOTE — Progress Notes (Signed)
OFFICE VISIT  06/15/2022  CC:  Chief Complaint  Patient presents with   Hypertension    Pt is not fasting.     Patient is a 62 y.o. male who presents for 19-month follow-up uncontrolled hypertension. A/P as of last visit: Last visit we increased his atenolol to 50 mg a day.  INTERIM HX: Joseph Friedman feels well. His blood pressures are around 125-130 systolic over 65-75 diastolic.  Heart rate average in the 60s.   Past Medical History:  Diagnosis Date   BPH with obstruction/lower urinary tract symptoms 2020   flomax helping   Diverticulosis    History of adenomatous polyp of colon 2013; 07/2016   09/06/21 tubular adenoma x 1.  Recall 5 yrs   History of vitamin D deficiency    Hypertension    since 1990s   Lipoma of arm    confirmed by MRI in remote past.   Thyroid nodule 05/08/2017   History of benign thyroid cyst.  Repeated specialist f/u showed all was fine, no recurrence, has always been euthyroid. Dr. Orbie Hurst f/u 05/2017 showed multiple bilat nodules but none met criteria for bx--no further testing needed.    Past Surgical History:  Procedure Laterality Date   COLONOSCOPY  02/18/2011; 07/26/16   2013; Adenomatous polyps+; 2018 + polyps. 09/06/21 adenoma x 1-->recall 5 yrs.   POLYPECTOMY      Outpatient Medications Prior to Visit  Medication Sig Dispense Refill   atenolol (TENORMIN) 50 MG tablet Take 1 tablet (50 mg total) by mouth daily. 90 tablet 3   B Complex-C (SUPER B COMPLEX PO) Take by mouth daily. Taking 4 days a week     cholecalciferol (VITAMIN D) 1000 UNITS tablet Take 2,000 Units by mouth daily.     Multiple Vitamin (MULTIVITAMIN) tablet Take 1 tablet by mouth daily.     tamsulosin (FLOMAX) 0.4 MG CAPS capsule Take 1 capsule (0.4 mg total) by mouth daily. MUST KEEP APPT FOR FURTHER REFILLS 90 capsule 0   No facility-administered medications prior to visit.    Allergies  Allergen Reactions   Augmentin [Amoxicillin-Pot Clavulanate] Diarrhea   Ditropan  [Oxybutynin] Rash    Review of Systems As per HPI  PE:    06/15/2022    8:44 AM 04/19/2022   10:25 AM 03/26/2021    8:48 AM  Vitals with BMI  Height  5\' 9"  5\' 10"   Weight 163 lbs 13 oz 160 lbs 6 oz 161 lbs 13 oz  BMI  23.68 23.22  Systolic 119 129 295  Diastolic 73 77 67  Pulse 75 62 69     Physical Exam  Gen: Alert, well appearing.  Patient is oriented to person, place, time, and situation.` AFFECT: pleasant, lucid thought and speech. No further exam today  LABS:  Last CBC Lab Results  Component Value Date   WBC 4.7 04/19/2022   HGB 14.2 04/19/2022   HCT 41.9 04/19/2022   MCV 95.8 04/19/2022   RDW 12.3 04/19/2022   PLT 183.0 04/19/2022   Last metabolic panel Lab Results  Component Value Date   GLUCOSE 90 04/19/2022   NA 135 04/19/2022   K 4.0 04/19/2022   CL 100 04/19/2022   CO2 30 04/19/2022   BUN 13 04/19/2022   CREATININE 0.84 04/19/2022   CALCIUM 9.2 04/19/2022   PROT 7.2 04/19/2022   ALBUMIN 4.4 04/19/2022   BILITOT 1.0 04/19/2022   ALKPHOS 47 04/19/2022   AST 24 04/19/2022   ALT 21 04/19/2022   Last lipids  Lab Results  Component Value Date   CHOL 192 04/19/2022   HDL 69.30 04/19/2022   LDLCALC 109 (H) 04/19/2022   TRIG 71.0 04/19/2022   CHOLHDL 3 04/19/2022   Last thyroid functions Lab Results  Component Value Date   TSH 1.23 04/19/2022   Last vitamin D Lab Results  Component Value Date   VD25OH 50.09 04/19/2022   IMPRESSION AND PLAN:  Hypertension, well-controlled on atenolol 50 mg daily.  An After Visit Summary was printed and given to the patient.  FOLLOW UP: Return for Keep appointment already scheduled for April 2025 CPE.  Signed:  Santiago Bumpers, MD           06/15/2022

## 2022-08-12 ENCOUNTER — Other Ambulatory Visit: Payer: Self-pay

## 2022-08-12 ENCOUNTER — Encounter: Payer: Self-pay | Admitting: Family Medicine

## 2022-08-12 ENCOUNTER — Other Ambulatory Visit: Payer: Self-pay | Admitting: Family Medicine

## 2022-08-12 MED ORDER — TAMSULOSIN HCL 0.4 MG PO CAPS: 0.4000 mg | ORAL_CAPSULE | Freq: Every day | ORAL | 3 refills | Status: DC

## 2022-11-11 ENCOUNTER — Ambulatory Visit (INDEPENDENT_AMBULATORY_CARE_PROVIDER_SITE_OTHER): Payer: 59

## 2022-11-11 DIAGNOSIS — Z23 Encounter for immunization: Secondary | ICD-10-CM

## 2022-11-11 NOTE — Progress Notes (Signed)
Pt presented for flu vaccine, given IM left deltoid.

## 2023-02-21 ENCOUNTER — Other Ambulatory Visit: Payer: Self-pay | Admitting: Family Medicine

## 2023-02-28 ENCOUNTER — Other Ambulatory Visit: Payer: Self-pay

## 2023-02-28 ENCOUNTER — Telehealth: Payer: Self-pay

## 2023-02-28 ENCOUNTER — Encounter: Payer: Self-pay | Admitting: Family Medicine

## 2023-02-28 MED ORDER — TAMSULOSIN HCL 0.4 MG PO CAPS: 0.4000 mg | ORAL_CAPSULE | Freq: Every day | ORAL | 1 refills | Status: DC

## 2023-02-28 NOTE — Telephone Encounter (Signed)
Reason for CRM: Patient called in regarding wanting to speak with a nurse about medication    tamsulosin (FLOMAX) 0.4 MG CAPS capsule approval to be sent to [facility] , he is requesting for a nurse to give him a callback back about this matter                Rx sent. See mychart.

## 2023-04-01 ENCOUNTER — Other Ambulatory Visit: Payer: Self-pay | Admitting: Family Medicine

## 2023-04-18 NOTE — Patient Instructions (Signed)

## 2023-04-20 ENCOUNTER — Encounter: Payer: Self-pay | Admitting: Family Medicine

## 2023-04-20 ENCOUNTER — Ambulatory Visit (INDEPENDENT_AMBULATORY_CARE_PROVIDER_SITE_OTHER): Payer: 59 | Admitting: Family Medicine

## 2023-04-20 VITALS — BP 126/72 | HR 62 | Ht 69.75 in | Wt 158.6 lb

## 2023-04-20 DIAGNOSIS — Z833 Family history of diabetes mellitus: Secondary | ICD-10-CM | POA: Diagnosis not present

## 2023-04-20 DIAGNOSIS — Z0000 Encounter for general adult medical examination without abnormal findings: Secondary | ICD-10-CM | POA: Diagnosis not present

## 2023-04-20 DIAGNOSIS — I10 Essential (primary) hypertension: Secondary | ICD-10-CM | POA: Diagnosis not present

## 2023-04-20 DIAGNOSIS — Z8639 Personal history of other endocrine, nutritional and metabolic disease: Secondary | ICD-10-CM | POA: Diagnosis not present

## 2023-04-20 DIAGNOSIS — Z125 Encounter for screening for malignant neoplasm of prostate: Secondary | ICD-10-CM | POA: Diagnosis not present

## 2023-04-20 LAB — COMPREHENSIVE METABOLIC PANEL WITH GFR
ALT: 16 U/L (ref 0–53)
AST: 19 U/L (ref 0–37)
Albumin: 4.5 g/dL (ref 3.5–5.2)
Alkaline Phosphatase: 46 U/L (ref 39–117)
BUN: 13 mg/dL (ref 6–23)
CO2: 32 meq/L (ref 19–32)
Calcium: 9.2 mg/dL (ref 8.4–10.5)
Chloride: 101 meq/L (ref 96–112)
Creatinine, Ser: 0.88 mg/dL (ref 0.40–1.50)
GFR: 90.71 mL/min (ref >60.00)
Glucose, Bld: 87 mg/dL (ref 70–99)
Potassium: 3.9 meq/L (ref 3.5–5.1)
Sodium: 137 meq/L (ref 135–145)
Total Bilirubin: 1.1 mg/dL (ref 0.2–1.2)
Total Protein: 7.2 g/dL (ref 6.0–8.3)

## 2023-04-20 LAB — LIPID PANEL
Cholesterol: 172 mg/dL (ref 0–200)
HDL: 67.1 mg/dL (ref >39.00)
LDL Cholesterol: 93 mg/dL (ref 0–99)
NonHDL: 105.04
Total CHOL/HDL Ratio: 3
Triglycerides: 60 mg/dL (ref 0.0–149.0)
VLDL: 12 mg/dL (ref 0.0–40.0)

## 2023-04-20 LAB — CBC WITH DIFFERENTIAL/PLATELET
Basophils Absolute: 0 10*3/uL (ref 0.0–0.1)
Basophils Relative: 0.6 % (ref 0.0–3.0)
Eosinophils Absolute: 0.1 10*3/uL (ref 0.0–0.7)
Eosinophils Relative: 3.5 % (ref 0.0–5.0)
HCT: 41.2 % (ref 39.0–52.0)
Hemoglobin: 13.8 g/dL (ref 13.0–17.0)
Lymphocytes Relative: 36.6 % (ref 12.0–46.0)
Lymphs Abs: 1.5 10*3/uL (ref 0.7–4.0)
MCHC: 33.5 g/dL (ref 30.0–36.0)
MCV: 96.9 fl (ref 78.0–100.0)
Monocytes Absolute: 0.4 10*3/uL (ref 0.1–1.0)
Monocytes Relative: 8.7 % (ref 3.0–12.0)
Neutro Abs: 2.1 10*3/uL (ref 1.4–7.7)
Neutrophils Relative %: 50.6 % (ref 43.0–77.0)
Platelets: 185 10*3/uL (ref 150.0–400.0)
RBC: 4.26 Mil/uL (ref 4.22–5.81)
RDW: 12.3 % (ref 11.5–15.5)
WBC: 4.1 10*3/uL (ref 4.0–10.5)

## 2023-04-20 LAB — HEMOGLOBIN A1C: Hgb A1c MFr Bld: 4.7 % (ref 4.6–6.5)

## 2023-04-20 LAB — VITAMIN D 25 HYDROXY (VIT D DEFICIENCY, FRACTURES): VITD: 40.5 ng/mL (ref 30.00–100.00)

## 2023-04-20 LAB — PSA: PSA: 0.68 ng/mL (ref 0.10–4.00)

## 2023-04-20 MED ORDER — ATENOLOL 50 MG PO TABS: 50.0000 mg | ORAL_TABLET | Freq: Every day | ORAL | 3 refills | Status: AC

## 2023-04-20 MED ORDER — TAMSULOSIN HCL 0.4 MG PO CAPS: 0.4000 mg | ORAL_CAPSULE | Freq: Every day | ORAL | 3 refills | Status: AC

## 2023-04-20 NOTE — Progress Notes (Signed)
 Office Note 04/20/2023  CC:  Chief Complaint  Patient presents with   Annual Exam    Pt is fasting    HPI:  Patient is a 63 y.o. male who is here for annual health maintenance exam and follow-up hypertension. Gregary Signs is feeling well. Very good diet and exercise habits.  Home blood pressure monitoring shows 120s over 60s consistently.  Past Medical History:  Diagnosis Date   BPH with obstruction/lower urinary tract symptoms 2020   flomax helping   Diverticulosis    History of adenomatous polyp of colon 2013; 07/2016   09/06/21 tubular adenoma x 1.  Recall 5 yrs   History of vitamin D deficiency    Hypertension    since 1990s   Lipoma of arm    confirmed by MRI in remote past.   Thyroid nodule 05/08/2017   History of benign thyroid cyst.  Repeated specialist f/u showed all was fine, no recurrence, has always been euthyroid. Dr. Orbie Hurst f/u 05/2017 showed multiple bilat nodules but none met criteria for bx--no further testing needed.    Past Surgical History:  Procedure Laterality Date   COLONOSCOPY  02/18/2011; 07/26/16   2013; Adenomatous polyps+; 2018 + polyps. 09/06/21 adenoma x 1-->recall 5 yrs.   POLYPECTOMY      Family History  Problem Relation Age of Onset   Hypertension Mother    Thyroid disease Mother    Heart disease Father    Thyroid disease Sister    Heart attack Brother    Colon cancer Neg Hx    Colon polyps Neg Hx    Esophageal cancer Neg Hx    Rectal cancer Neg Hx    Stomach cancer Neg Hx     Social History   Socioeconomic History   Marital status: Married    Spouse name: Not on file   Number of children: Not on file   Years of education: Not on file   Highest education level: Not on file  Occupational History   Not on file  Tobacco Use   Smoking status: Never   Smokeless tobacco: Never  Substance and Sexual Activity   Alcohol use: Yes    Comment: socially    Drug use: No   Sexual activity: Not on file  Other Topics Concern   Not on  file  Social History Narrative   Married, one adult daughter.   Lives in [place].  Nationality: [place], relocated to [place] in the 1970s.   Ed: College grad.   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   No tobacco or drugs.   Alcohol: social.   Exercises at least 3X/week.   Social Drivers of Corporate investment banker Strain: Not on file  Food Insecurity: Not on file  Transportation Needs: Not on file  Physical Activity: Not on file  Stress: Not on file  Social Connections: Unknown (05/17/2021)   Received from [facility], [facility]   Social Network    Social Network: Not on file  Intimate Partner Violence: Unknown (05/17/2021)   [facility]   HITS    Physically Hurt: Not on file    Insult or Talk Down To: Not on file    Threaten Physical Harm: Not on file    Scream or Curse: Not on file    Outpatient Medications Prior to Visit  Medication Sig Dispense Refill   atenolol (TENORMIN) 50 MG tablet TAKE ONE TABLET BY MOUTH ONCE A DAY 30 tablet  0   B Complex-C (SUPER B COMPLEX PO) Take by mouth daily. Taking 4 days a week     cholecalciferol (VITAMIN D) 1000 UNITS tablet Take 2,000 Units by mouth daily.     Multiple Vitamin (MULTIVITAMIN) tablet Take 1 tablet by mouth daily.     tamsulosin (FLOMAX) 0.4 MG CAPS capsule Take 1 capsule (0.4 mg total) by mouth daily. 90 capsule 1   No facility-administered medications prior to visit.    Allergies  Allergen Reactions   Augmentin [Amoxicillin-Pot Clavulanate] Diarrhea   Ditropan [Oxybutynin] Rash    Review of Systems  Constitutional:  Negative for appetite change, chills, fatigue and fever.  HENT:  Negative for congestion, dental problem, ear pain and sore throat.   Eyes:  Negative for discharge, redness and visual disturbance.  Respiratory:  Negative for cough (e3), chest tightness, shortness of breath and wheezing.   Cardiovascular:  Negative for chest pain,  palpitations and leg swelling.  Gastrointestinal:  Negative for abdominal pain, blood in stool, diarrhea, nausea and vomiting.  Genitourinary:  Negative for difficulty urinating, dysuria, flank pain, frequency, hematuria and urgency.  Musculoskeletal:  Negative for arthralgias, back pain, joint swelling, myalgias and neck stiffness.  Skin:  Negative for pallor and rash.  Neurological:  Negative for dizziness, speech difficulty, weakness and headaches.  Hematological:  Negative for adenopathy. Does not bruise/bleed easily.  Psychiatric/Behavioral:  Negative for confusion and sleep disturbance. The patient is not nervous/anxious.     PE;    04/20/2023    7:54 AM 06/15/2022    8:44 AM 04/19/2022   10:25 AM  Vitals with BMI  Height 5' 9.75"  5\' 9"   Weight 158 lbs 10 oz 163 lbs 13 oz 160 lbs 6 oz  BMI 22.91  23.68  Systolic  119 129  Diastolic  73 77  Pulse  75 62    Gen: Alert, well appearing.  Patient is oriented to person, place, time, and situation. AFFECT: pleasant, lucid thought and speech. ENT: Ears: EACs clear, normal epithelium.  TMs with good light reflex and landmarks bilaterally.  Eyes: no injection, icteris, swelling, or exudate.  EOMI, PERRLA. Nose: no drainage or turbinate edema/swelling.  No injection or focal lesion.  Mouth: lips without lesion/swelling.  Oral mucosa pink and moist.  Dentition intact and without obvious caries or gingival swelling.  Oropharynx without erythema, exudate, or swelling.  Neck: supple/nontender.  No LAD, mass, or TM.  Carotid pulses 2+ bilaterally, without bruits. CV: RRR, no m/r/g.   LUNGS: CTA bilat, nonlabored resps, good aeration in all lung fields. ABD: soft, NT, ND, BS normal.  No hepatospenomegaly or mass.  No bruits. EXT: no clubbing, cyanosis, or edema.  Musculoskeletal: no joint swelling, erythema, warmth, or tenderness.  ROM of all joints intact. Skin - no sores or suspicious lesions or rashes or color changes  Pertinent labs:  Lab  Results  Component Value Date   TSH 1.23 04/19/2022   Lab Results  Component Value Date   WBC 4.7 04/19/2022   HGB 14.2 04/19/2022   HCT 41.9 04/19/2022   MCV 95.8 04/19/2022   PLT 183.0 04/19/2022   Lab Results  Component Value Date   CREATININE 0.84 04/19/2022   BUN 13 04/19/2022   NA 135 04/19/2022   K 4.0 04/19/2022   CL 100 04/19/2022   CO2 30 04/19/2022   Lab Results  Component Value Date   ALT 21 04/19/2022   AST 24 04/19/2022   ALKPHOS 47 04/19/2022  BILITOT 1.0 04/19/2022   Lab Results  Component Value Date   CHOL 192 04/19/2022   Lab Results  Component Value Date   HDL 69.30 04/19/2022   Lab Results  Component Value Date   LDLCALC 109 (H) 04/19/2022   Lab Results  Component Value Date   TRIG 71.0 04/19/2022   Lab Results  Component Value Date   CHOLHDL 3 04/19/2022   Lab Results  Component Value Date   PSA 0.45 04/19/2022   PSA 0.29 03/26/2021   PSA 0.40 03/17/2020  Last vitamin D Lab Results  Component Value Date   VD25OH 50.09 04/19/2022   ASSESSMENT AND PLAN:   #1 health maintenance exam: Reviewed age and gender appropriate health maintenance issues (prudent diet, regular exercise, health risks of tobacco and excessive alcohol, use of seatbelts, fire alarms in home, use of sunscreen).  Also reviewed age and gender appropriate health screening as well as vaccine recommendations. Vaccines: ALL UTD. Labs: fasting HP + PSA. Prostate ca screening:  PSA today Colon ca screening: hx adenomas, most recent colonoscopy 08/2021, recall approx 08/2026.  #2 hypertension, well-controlled on atenolol 50 mg a day. Electrolytes and creatinine today.  #3 BPH with lower urinary tract obstructive symptoms. Well-controlled on tamsulosin 0.4 mg nightly.  #4 history of vitamin D deficiency. He takes 2000 units of vitamin D daily. Check vitamin D level today.  #5 family history of diabetes.  He has 3 siblings with diabetes.  He requests hemoglobin A1c  today.  An After Visit Summary was printed and given to the patient.  FOLLOW UP:  No follow-ups on file.  Signed:  Santiago Bumpers, MD           04/20/2023

## 2023-04-21 NOTE — Telephone Encounter (Signed)
 No further action needed.

## 2024-04-22 ENCOUNTER — Encounter: Admitting: Family Medicine
